# Patient Record
Sex: Male | Born: 1965 | Race: White | Hispanic: No | Marital: Married | Smoking: Never smoker
Health system: Southern US, Community
[De-identification: ages and names within clinical notes are randomized; demographics above are authoritative.]

## PROBLEM LIST (undated history)

## (undated) DIAGNOSIS — K219 Gastro-esophageal reflux disease without esophagitis: Secondary | ICD-10-CM

## (undated) DIAGNOSIS — Z87442 Personal history of urinary calculi: Secondary | ICD-10-CM

## (undated) DIAGNOSIS — H9313 Tinnitus, bilateral: Secondary | ICD-10-CM

## (undated) HISTORY — PX: COLONOSCOPY: SHX174

## (undated) HISTORY — PX: BACK SURGERY: SHX140

---

## 2006-07-01 ENCOUNTER — Ambulatory Visit (HOSPITAL_COMMUNITY): Admission: RE | Admit: 2006-07-01 | Discharge: 2006-07-01 | Payer: Self-pay | Admitting: Family Medicine

## 2011-01-14 ENCOUNTER — Other Ambulatory Visit (HOSPITAL_COMMUNITY): Payer: Self-pay | Admitting: Orthopedic Surgery

## 2011-01-14 ENCOUNTER — Encounter (HOSPITAL_COMMUNITY)
Admission: RE | Admit: 2011-01-14 | Discharge: 2011-01-14 | Disposition: A | Discharge status: Home / Self Care | Payer: BC Managed Care – PPO | Source: Ambulatory Visit | Attending: Orthopedic Surgery | Admitting: Orthopedic Surgery

## 2011-01-14 DIAGNOSIS — M48061 Spinal stenosis, lumbar region without neurogenic claudication: Secondary | ICD-10-CM

## 2011-01-14 LAB — CBC
Hemoglobin: 15 g/dL (ref 13.0–17.0)
Platelets: 250 10*3/uL (ref 150–400)
RDW: 11.8 % (ref 11.5–15.5)
WBC: 6.5 10*3/uL (ref 4.0–10.5)

## 2011-01-14 LAB — SURGICAL PCR SCREEN: MRSA, PCR: NEGATIVE

## 2011-01-24 ENCOUNTER — Ambulatory Visit (HOSPITAL_COMMUNITY)
Admission: RE | Admit: 2011-01-24 | Discharge: 2011-01-25 | DRG: 758 | Disposition: A | Discharge status: Home / Self Care | Payer: BC Managed Care – PPO | Source: Ambulatory Visit | Attending: Orthopedic Surgery | Admitting: Orthopedic Surgery

## 2011-01-24 ENCOUNTER — Other Ambulatory Visit: Payer: Self-pay | Admitting: Orthopedic Surgery

## 2011-01-24 ENCOUNTER — Ambulatory Visit (HOSPITAL_COMMUNITY): Payer: BC Managed Care – PPO

## 2011-01-24 DIAGNOSIS — M48061 Spinal stenosis, lumbar region without neurogenic claudication: Secondary | ICD-10-CM | POA: Insufficient documentation

## 2011-01-24 DIAGNOSIS — Z01810 Encounter for preprocedural cardiovascular examination: Secondary | ICD-10-CM | POA: Insufficient documentation

## 2011-01-24 DIAGNOSIS — Z01812 Encounter for preprocedural laboratory examination: Secondary | ICD-10-CM | POA: Insufficient documentation

## 2011-01-24 DIAGNOSIS — M5126 Other intervertebral disc displacement, lumbar region: Secondary | ICD-10-CM | POA: Insufficient documentation

## 2011-01-25 NOTE — Op Note (Signed)
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  OPERATIVE FINDINGS:  Significantly large L4-5 disk herniation extending from the L4-5 disk into the posterior lateral gutter causing marked central and lateral recess stenosis, causing significant L4 nerve compression and L5 traversing nerve root compression.  HISTORY:  This is a very pleasant 45 year old gentleman who has been having severe back, buttock, and left leg pain for some time now.  After failing conservative treatment consisting of physiotherapy, analgesics,injection therapy, activity modification, he continued to deteriorate and elected to proceed with surgery.  All appropriate risks, benefits, and alternatives to surgery were discussed.  Consent was obtained.  OPERATIVE NOTE:  The patient was brought into the operating room, placed supine on the operating table.  After successful induction of general anesthesia, endotracheal intubation, TED, SCDs, and a Foley were inserted.  He was turned prone onto the [place] frame.  All bony prominences were well-padded.  The back was prepped and draped in standard fashion.  Appropriate time-out was done to confirm patient procedure and all other pertinent important data.  Once this was done, 2 needles were placed into the back.  X-rays were taken for localization of the incision.  The skin was infiltrated with 0.25% epinephrine and a midline incision was made starting from the superior aspect of L4 and proceeding to the superior aspect of S1.  Sharp dissection was  carried out down to the deep fascia.  Deep fascia was sharply incised and I used a Cobb elevator to dissect paraspinal muscles to expose the L4 and L5 lamina and a portion of that of S1.  Once I had bilateral exposure, I placed a Penfield 4 underneath the L5 lamina and took a second x-ray and confirmed I was at the appropriate level.  At this point using a double- action Leksell rongeur, I removed the spinous process of L5 in its entirety and 50% of the L4 spinous process.  I then used 3-mm and 2-mm Kerrison to perform a generous L4 bilateral laminotomy and then a complete left-sided laminectomy of L5.  At this point, I then resected the ligamentum flavum again using 2 and 3-mm Kerrison.  At this point I could see a very large extruded fragment of disk that had come up from the L4-5 disk space and extended into the lateral recess and was compressing and exiting up into the axilla of the L4 nerve root.  I was able to use a Penfield 4 to dissect the fragment free and I removed it. Because of its large size that was actually sent to Pathology for further evaluation.  I then traced the disk herniation back to the 4-5 disk space and then removed the remaining fragment of disk material.  I then used a micropituitary  rongeur and went into the L4-5 disk space to remove any other loose fragments of disk.  There was still significant foraminal compromise causing left L4 nerve root compression and so I used a 2-mm Kerrison rongeur to perform a generous foraminotomy.  I was able to pass my [facility] out the neural foramen and it was no longer under undue tension.  I then went to the contralateral side and completed the L4-5 central decompression and into the lateral gutter.  I then swept the L5 traversing nerve root medially, identified the L4-5 disk space and checked for any central fragments that were coming to the right side.  At this point, I irrigated copiously with normal saline, used  bipolar electrocautery to coagulate the very large epidural veins and then checked once more with my Public house manager.  I palpated superiorly to check that my central decompression and my bilateral decompression at L4-5 was complete.  The lateral recesses were adequately decompressed.  I could visualize the exiting nerve root and was free of any tension.  I then again examined the disk space.  There was no further free fragments of disk.  I could sweep my Woodson elevator circumferentially from lateral to medial.  With the central decompression complete, I then went down the left-sided L5-S1 and again there was no significant tension.  At this point, I irrigated copiously with normal saline, placed the deep drain and then thrombin-soaked Gelfoam patty over the exposed thecal sac.  I then closed in a layered fashion with interrupted #1 Vicryl suture, 2-0 Vicryl suture, and 3-0 Monocryl.  Steri-Strips and dry dressing were applied.  The patient was extubated, transferred to PACU without incident.  At the end of the case, all needle and sponge counts were correct.     Alvy Beal, MD     DDB/MEDQ  D:  01/24/2011  T:  01/24/2011  Job:  [id number]  Electronically Signed by Venita Lick MD on 01/25/2011 03:50:58 PM

## 2012-04-30 IMAGING — CR DG LUMBAR SPINE 2-3V
1 series · 1 of 1 positions shown · non-contrast
Comparison: Preoperative lumbar spine series 01/14/2011.

CLINICAL DATA: Intraoperative localization for lumbar surgery.

LUMBAR SPINE - 2-3 VIEW

[view not recorded]
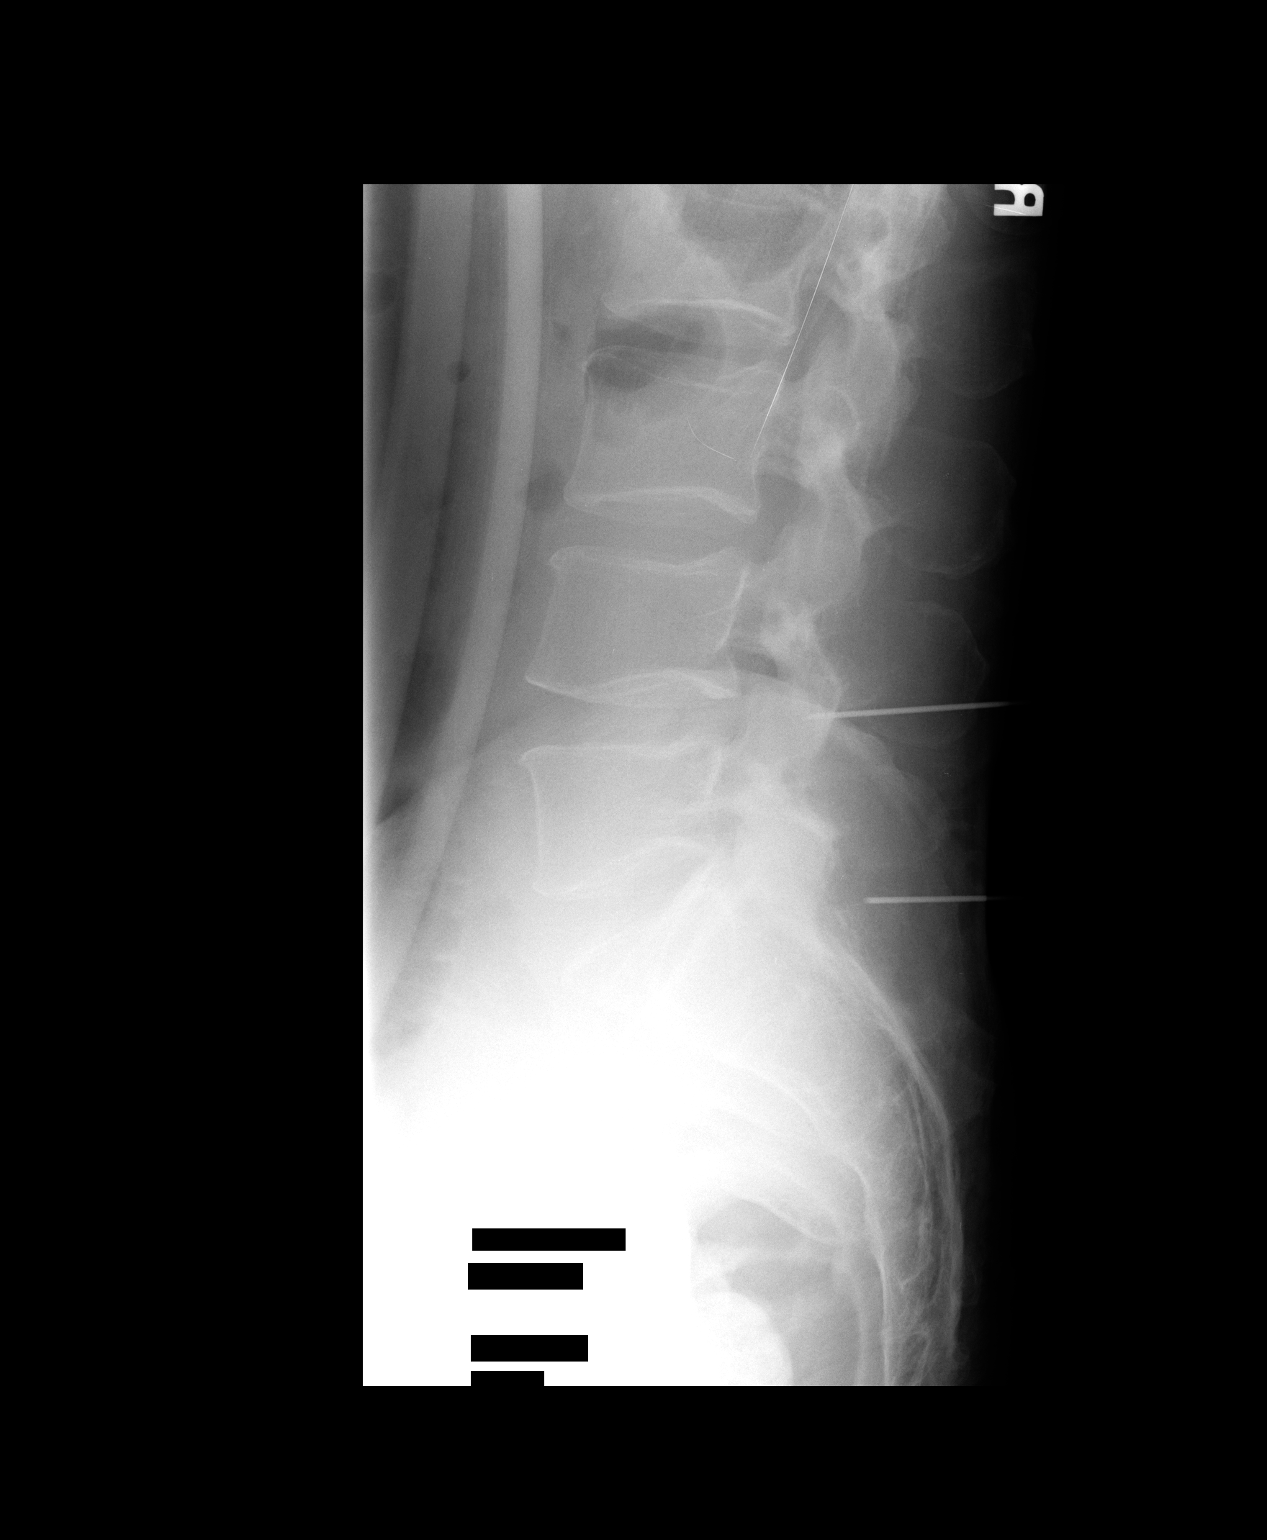

[1 of 1 positions shown; findings below may reference images not displayed]

FINDINGS: There are spinal needles projecting at the L4-5 and L5-S1
disc space levels.
IMPRESSION: L4-5 and L5-S1 marked intraoperatively.

## 2013-05-04 ENCOUNTER — Other Ambulatory Visit: Payer: Self-pay | Admitting: Family Medicine

## 2013-05-04 DIAGNOSIS — R109 Unspecified abdominal pain: Secondary | ICD-10-CM

## 2013-05-05 ENCOUNTER — Other Ambulatory Visit: Payer: BC Managed Care – PPO

## 2013-05-17 ENCOUNTER — Ambulatory Visit
Admission: RE | Admit: 2013-05-17 | Discharge: 2013-05-17 | Disposition: A | Discharge status: Home / Self Care | Payer: BC Managed Care – PPO | Source: Ambulatory Visit | Attending: Family Medicine | Admitting: Family Medicine

## 2013-05-17 DIAGNOSIS — R109 Unspecified abdominal pain: Secondary | ICD-10-CM

## 2015-03-27 ENCOUNTER — Other Ambulatory Visit: Payer: Self-pay | Admitting: Family Medicine

## 2015-03-27 DIAGNOSIS — M79606 Pain in leg, unspecified: Secondary | ICD-10-CM

## 2015-03-30 ENCOUNTER — Other Ambulatory Visit: Payer: BC Managed Care – PPO

## 2015-04-03 ENCOUNTER — Other Ambulatory Visit: Payer: Self-pay | Admitting: *Deleted

## 2015-04-03 DIAGNOSIS — R200 Anesthesia of skin: Secondary | ICD-10-CM

## 2015-04-06 ENCOUNTER — Ambulatory Visit
Admission: RE | Admit: 2015-04-06 | Discharge: 2015-04-06 | Disposition: A | Discharge status: Home / Self Care | Payer: BC Managed Care – PPO | Source: Ambulatory Visit | Attending: Family Medicine | Admitting: Family Medicine

## 2015-04-06 DIAGNOSIS — M79606 Pain in leg, unspecified: Secondary | ICD-10-CM

## 2015-04-11 ENCOUNTER — Ambulatory Visit (INDEPENDENT_AMBULATORY_CARE_PROVIDER_SITE_OTHER): Payer: BC Managed Care – PPO | Admitting: Neurology

## 2015-04-11 DIAGNOSIS — R200 Anesthesia of skin: Secondary | ICD-10-CM | POA: Diagnosis not present

## 2015-04-11 NOTE — Procedures (Signed)
[facility]  [address], [address]  [place], [place] [address] Tel: [phone] Fax:  [phone] Test Date:  04/11/2015  Patient: Dakota CarnesDavid Caya DOB: 10/04/1965 Physician: Nita Sickleonika Ameira Alessandrini  Sex: Male Height: 6\' 2"  Ref Phys: Johny BlamerWilliam Harris, M.D.  ID#: [id number] Temp: 33.1C Technician: Judie PetitM. Dean   Patient Complaints: This is a 49 year old gentleman referred for evaluation of bilateral feet paresthesias.  NCV & EMG Findings: Extensive electrodiagnostic testing of the right lower extremity and additional studies of the left shows: 1. Bilateral sural and superficial peroneal sensory responses are within normal limits. 2. Bilateral peroneal and tibial motor responses are within normal limits. 3. Bilateral tibial H reflex studies are prolonged, and most likely due to patient's height. 4. There is no evidence of active or chronic motor axon loss changes affecting any of the tested muscles. Motor unit configuration and recruitment pattern is within normal limits.  Impression: This is a normal study.  In particular, there is no evidence of a large fiber generalized sensorimotor polyneuropathy or lumbosacral radiculopathy. However, a small fiber neuropathy cannot be excluded by this study.   _____________________________ Nita Sickleonika Catarina Huntley, D.O.    Nerve Conduction Studies Anti Sensory Summary Table   Stim Site NR Peak (ms) Norm Peak (ms) P-T Amp (V) Norm P-T Amp  Left Sup Peroneal Anti Sensory (Ant Lat Mall)  33.1C  12 cm    2.8 <4.6 9.9 >4  Right Sup Peroneal Anti Sensory (Ant Lat Mall)  33.1C  12 cm    3.1 <4.6 7.6 >4  Left Sural Anti Sensory (Lat Mall)  33.1C  Calf    4.0 <4.6 5.8 >4  Right Sural Anti Sensory (Lat Mall)  33.1C  Calf    2.9 <4.6 4.1 >4   Motor Summary Table   Stim Site NR Onset (ms) Norm Onset (ms) O-P Amp (mV) Norm O-P Amp Site1 Site2 Delta-0 (ms) Dist (cm) Vel (m/s) Norm Vel (m/s)  Left Peroneal Motor (Ext Dig Brev)  33.1C  Ankle    4.1 <6.0 3.5  >2.5 B Fib Ankle 8.4 38.0 45 >40  B Fib    12.5  3.2  Poplt B Fib 2.3 10.0 43 >40  Poplt    14.8  3.1         Right Peroneal Motor (Ext Dig Brev)  33.1C  Ankle    4.4 <6.0 3.3 >2.5 B Fib Ankle 7.6 38.0 50 >40  B Fib    12.0  3.4  Poplt B Fib 2.0 10.0 50 >40  Poplt    14.0  3.4         Left Tibial Motor (Abd Hall Brev)  33.1C  Ankle    3.5 <6.0 13.5 >4 Knee Ankle 10.1 42.0 42 >40  Knee    13.6  9.4         Right Tibial Motor (Abd Hall Brev)  33.1C  Ankle    5.1 <6.0 12.5 >4 Knee Ankle 10.1 45.0 45 >40  Knee    15.2  10.1          H Reflex Studies   NR H-Lat (ms) Lat Norm (ms) L-R H-Lat (ms)  Left Tibial (Gastroc)  33.1C     36.60 <35 1.63  Right Tibial (Gastroc)  33.1C     38.23 <35 1.63   EMG   Side Muscle Ins Act Fibs Psw Fasc Number Recrt Dur Dur. Amp Amp. Poly Poly. Comment  Right AntTibialis Nml Nml Nml Nml Nml Nml Nml Nml Nml  Nml Nml Nml N/A  Right Gastroc Nml Nml Nml Nml Nml Nml Nml Nml Nml Nml Nml Nml N/A  Right Flex Dig Long Nml Nml Nml Nml Nml Nml Nml Nml Nml Nml Nml Nml N/A  Right RectFemoris Nml Nml Nml Nml Nml Nml Nml Nml Nml Nml Nml Nml N/A  Right BicepsFemS Nml Nml Nml Nml Nml Nml Nml Nml Nml Nml Nml Nml N/A  Right GluteusMed Nml Nml Nml Nml Nml Nml Nml Nml Nml Nml Nml Nml N/A      Waveforms:

## 2016-09-05 ENCOUNTER — Ambulatory Visit: Payer: Self-pay | Admitting: Physician Assistant

## 2016-09-13 ENCOUNTER — Other Ambulatory Visit: Payer: Self-pay | Admitting: Orthopedic Surgery

## 2016-09-23 ENCOUNTER — Encounter (HOSPITAL_COMMUNITY)
Admission: RE | Admit: 2016-09-23 | Discharge: 2016-09-23 | Disposition: A | Discharge status: Home / Self Care | Payer: BC Managed Care – PPO | Source: Ambulatory Visit | Attending: Orthopedic Surgery | Admitting: Orthopedic Surgery

## 2016-09-23 ENCOUNTER — Encounter (HOSPITAL_COMMUNITY): Payer: Self-pay

## 2016-09-23 HISTORY — DX: Gastro-esophageal reflux disease without esophagitis: K21.9

## 2016-09-23 HISTORY — DX: Personal history of urinary calculi: Z87.442

## 2016-09-23 HISTORY — DX: Tinnitus, bilateral: H93.13

## 2016-09-23 LAB — BASIC METABOLIC PANEL
ANION GAP: 6 (ref 5–15)
BUN: 14 mg/dL (ref 6–20)
CHLORIDE: 105 mmol/L (ref 101–111)
CO2: 27 mmol/L (ref 22–32)
Calcium: 9.2 mg/dL (ref 8.9–10.3)
Creatinine, Ser: 0.94 mg/dL (ref 0.61–1.24)
GFR calc Af Amer: >60 mL/min (ref >60)
GFR calc non Af Amer: >60 mL/min (ref >60)
GLUCOSE: 104 mg/dL — AB (ref 65–99)
POTASSIUM: 4 mmol/L (ref 3.5–5.1)
Sodium: 138 mmol/L (ref 135–145)

## 2016-09-23 LAB — CBC
HEMATOCRIT: 43.9 % (ref 39.0–52.0)
HEMOGLOBIN: 15.4 g/dL (ref 13.0–17.0)
MCH: 35.4 pg — AB (ref 26.0–34.0)
MCHC: 35.1 g/dL (ref 30.0–36.0)
MCV: 100.9 fL — AB (ref 78.0–100.0)
PLATELETS: 192 10*3/uL (ref 150–400)
RBC: 4.35 MIL/uL (ref 4.22–5.81)
RDW: 11.9 % (ref 11.5–15.5)
WBC: 4.1 10*3/uL (ref 4.0–10.5)

## 2016-09-23 LAB — SURGICAL PCR SCREEN
MRSA, PCR: NEGATIVE
STAPHYLOCOCCUS AUREUS: NEGATIVE

## 2016-09-23 NOTE — Pre-Procedure Instructions (Signed)
    SAVAS ELVIN  09/23/2016      [id number] [place], [place] - [address] [place] [address] Phone: [phone] Fax: [phone]    Your procedure is scheduled on Thursday, May 3rd   Report to [facility] Admitting at 5:30 AM             (posted surgery time 7:30 am - 12:14 pm) .  Call this number if you have problems the morning of surgery:  [phone], for other questions [phone] Mon-Fri 8-4:30 pm   Remember:  Do not eat food or drink liquids after midnight Wednesay.   Take these medicines the morning of surgery with A SIP OF WATER : Gabapentin              4-5 days prior to surgery, STOP taking any Vitamins, Herbal Supplements, Anti-inflammatories   Do not wear jewelry - no rings or watches.  Do not wear lotions, colognes or deoderant.             Men may shave face and neck.   Do not bring valuables to the hospital.  [facility] is not responsible for any belongings or valuables.  Contacts, dentures or bridgework may not be worn into surgery.  Leave your suitcase in the car.  After surgery it may be brought to your room.  For patients admitted to the hospital, discharge time will be determined by your treatment team.   Please read over the following fact sheets that you were given. Pain Booklet, MRSA Information and Surgical Site Infection Prevention

## 2016-09-23 NOTE — Progress Notes (Signed)
PCP is Dr. Dossie Arbour  LOV 08/2016 Denies any cardiac issues, no tests, hasn't seen a cardio.

## 2016-09-26 ENCOUNTER — Inpatient Hospital Stay (HOSPITAL_COMMUNITY)
Admission: RE | Admit: 2016-09-26 | Discharge: 2016-09-27 | DRG: 473 | Disposition: A | Discharge status: Home / Self Care | Payer: BC Managed Care – PPO | Source: Ambulatory Visit | Attending: Orthopedic Surgery | Admitting: Orthopedic Surgery

## 2016-09-26 ENCOUNTER — Inpatient Hospital Stay (HOSPITAL_COMMUNITY): Payer: BC Managed Care – PPO | Admitting: Anesthesiology

## 2016-09-26 ENCOUNTER — Encounter (HOSPITAL_COMMUNITY)
Admission: RE | Disposition: A | Discharge status: Home / Self Care | Payer: Self-pay | Source: Ambulatory Visit | Attending: Orthopedic Surgery

## 2016-09-26 ENCOUNTER — Inpatient Hospital Stay (HOSPITAL_COMMUNITY): Payer: BC Managed Care – PPO

## 2016-09-26 ENCOUNTER — Encounter (HOSPITAL_COMMUNITY): Payer: Self-pay | Admitting: *Deleted

## 2016-09-26 DIAGNOSIS — M47812 Spondylosis without myelopathy or radiculopathy, cervical region: Principal | ICD-10-CM | POA: Diagnosis present

## 2016-09-26 DIAGNOSIS — G959 Disease of spinal cord, unspecified: Secondary | ICD-10-CM | POA: Diagnosis present

## 2016-09-26 DIAGNOSIS — Z981 Arthrodesis status: Secondary | ICD-10-CM

## 2016-09-26 DIAGNOSIS — Z419 Encounter for procedure for purposes other than remedying health state, unspecified: Secondary | ICD-10-CM

## 2016-09-26 HISTORY — PX: ANTERIOR CERVICAL DECOMP/DISCECTOMY FUSION: SHX1161

## 2016-09-26 SURGERY — ANTERIOR CERVICAL DECOMPRESSION/DISCECTOMY FUSION 3 LEVELS
Anesthesia: General | Site: Spine Cervical

## 2016-09-26 MED ORDER — PROPOFOL 500 MG/50ML IV EMUL
INTRAVENOUS | Status: DC | PRN
  Administered 2016-09-26: 75 ug/kg/min via INTRAVENOUS

## 2016-09-26 MED ORDER — ONDANSETRON HCL 4 MG/2ML IJ SOLN
INTRAMUSCULAR | Status: AC
  Filled 2016-09-26: qty 2

## 2016-09-26 MED ORDER — MIDAZOLAM HCL 5 MG/5ML IJ SOLN
INTRAMUSCULAR | Status: DC | PRN
  Administered 2016-09-26 (×2): 1 mg via INTRAVENOUS
  Administered 2016-09-26: 2 mg via INTRAVENOUS
  Administered 2016-09-26 (×2): 1 mg via INTRAVENOUS

## 2016-09-26 MED ORDER — SODIUM CHLORIDE 0.9% FLUSH
3.0000 mL | Freq: Two times a day (BID) | INTRAVENOUS | Status: DC
  Administered 2016-09-26: 3 mL via INTRAVENOUS

## 2016-09-26 MED ORDER — DEXAMETHASONE SODIUM PHOSPHATE 10 MG/ML IJ SOLN
INTRAMUSCULAR | Status: DC | PRN
  Administered 2016-09-26: 10 mg via INTRAVENOUS

## 2016-09-26 MED ORDER — METHOCARBAMOL 500 MG PO TABS
500.0000 mg | ORAL_TABLET | Freq: Four times a day (QID) | ORAL | Status: DC | PRN
  Administered 2016-09-26 – 2016-09-27 (×3): 500 mg via ORAL
  Filled 2016-09-26 (×2): qty 1

## 2016-09-26 MED ORDER — LACTATED RINGERS IV SOLN: INTRAVENOUS | Status: DC

## 2016-09-26 MED ORDER — HYDROMORPHONE HCL 1 MG/ML IJ SOLN
INTRAMUSCULAR | Status: AC
  Filled 2016-09-26: qty 1

## 2016-09-26 MED ORDER — DEXAMETHASONE SODIUM PHOSPHATE 4 MG/ML IJ SOLN: 4.0000 mg | Freq: Four times a day (QID) | INTRAMUSCULAR | Status: AC

## 2016-09-26 MED ORDER — LACTATED RINGERS IV SOLN
INTRAVENOUS | Status: DC | PRN
  Administered 2016-09-26: 08:00:00 via INTRAVENOUS

## 2016-09-26 MED ORDER — SODIUM CHLORIDE 0.9 % IV SOLN
INTRAVENOUS | Status: DC | PRN
  Administered 2016-09-26: 30 ug/min via INTRAVENOUS

## 2016-09-26 MED ORDER — ACETAMINOPHEN 650 MG RE SUPP: 650.0000 mg | RECTAL | Status: DC | PRN

## 2016-09-26 MED ORDER — MIDAZOLAM HCL 2 MG/2ML IJ SOLN
INTRAMUSCULAR | Status: AC
  Filled 2016-09-26: qty 2

## 2016-09-26 MED ORDER — ONDANSETRON HCL 4 MG/2ML IJ SOLN: 4.0000 mg | Freq: Once | INTRAMUSCULAR | Status: DC | PRN

## 2016-09-26 MED ORDER — LACTATED RINGERS IV SOLN
INTRAVENOUS | Status: DC | PRN
  Administered 2016-09-26 (×2): via INTRAVENOUS

## 2016-09-26 MED ORDER — PROPOFOL 10 MG/ML IV BOLUS
INTRAVENOUS | Status: AC
  Filled 2016-09-26: qty 20

## 2016-09-26 MED ORDER — ACETAMINOPHEN 325 MG PO TABS: 650.0000 mg | ORAL_TABLET | ORAL | Status: DC | PRN

## 2016-09-26 MED ORDER — OXYCODONE HCL 5 MG PO TABS
ORAL_TABLET | ORAL | Status: AC
  Filled 2016-09-26: qty 2

## 2016-09-26 MED ORDER — DEXTROSE 5 % IV SOLN
500.0000 mg | Freq: Four times a day (QID) | INTRAVENOUS | Status: DC | PRN
  Filled 2016-09-26: qty 5

## 2016-09-26 MED ORDER — MIDAZOLAM HCL 2 MG/2ML IJ SOLN
INTRAMUSCULAR | Status: AC
Start: 2016-09-26 — End: 2016-09-26
  Filled 2016-09-26: qty 2

## 2016-09-26 MED ORDER — OXYCODONE HCL 5 MG/5ML PO SOLN: 5.0000 mg | Freq: Once | ORAL | Status: DC | PRN

## 2016-09-26 MED ORDER — SODIUM CHLORIDE 0.9% FLUSH: 3.0000 mL | INTRAVENOUS | Status: DC | PRN

## 2016-09-26 MED ORDER — HEMOSTATIC AGENTS (NO CHARGE) OPTIME
TOPICAL | Status: DC | PRN
  Administered 2016-09-26: 1 via TOPICAL

## 2016-09-26 MED ORDER — PHENOL 1.4 % MT LIQD: 1.0000 | OROMUCOSAL | Status: DC | PRN

## 2016-09-26 MED ORDER — PROPOFOL 10 MG/ML IV BOLUS
INTRAVENOUS | Status: DC | PRN
  Administered 2016-09-26: 200 mg via INTRAVENOUS
  Administered 2016-09-26: 50 mg via INTRAVENOUS

## 2016-09-26 MED ORDER — DEXAMETHASONE SODIUM PHOSPHATE 10 MG/ML IJ SOLN
INTRAMUSCULAR | Status: AC
  Filled 2016-09-26: qty 1

## 2016-09-26 MED ORDER — METHOCARBAMOL 500 MG PO TABS
ORAL_TABLET | ORAL | Status: AC
  Filled 2016-09-26: qty 1

## 2016-09-26 MED ORDER — ONDANSETRON HCL 4 MG/2ML IJ SOLN: 4.0000 mg | Freq: Four times a day (QID) | INTRAMUSCULAR | Status: DC | PRN

## 2016-09-26 MED ORDER — SODIUM CHLORIDE 0.9 % IV SOLN
0.0500 ug/kg/min | INTRAVENOUS | Status: AC
  Administered 2016-09-26: .2 ug/kg/min via INTRAVENOUS
  Filled 2016-09-26: qty 5000

## 2016-09-26 MED ORDER — GABAPENTIN 400 MG PO CAPS
800.0000 mg | ORAL_CAPSULE | Freq: Two times a day (BID) | ORAL | Status: DC
  Administered 2016-09-26 – 2016-09-27 (×2): 800 mg via ORAL
  Filled 2016-09-26 (×2): qty 2

## 2016-09-26 MED ORDER — ACETAMINOPHEN 10 MG/ML IV SOLN
INTRAVENOUS | Status: DC | PRN
  Administered 2016-09-26: 1000 mg via INTRAVENOUS

## 2016-09-26 MED ORDER — HYDROMORPHONE HCL 1 MG/ML IJ SOLN
INTRAMUSCULAR | Status: AC
  Administered 2016-09-26: 0.5 mg via INTRAVENOUS
  Filled 2016-09-26: qty 1

## 2016-09-26 MED ORDER — CEFAZOLIN SODIUM-DEXTROSE 2-4 GM/100ML-% IV SOLN
2.0000 g | INTRAVENOUS | Status: DC
  Filled 2016-09-26: qty 100

## 2016-09-26 MED ORDER — PROPOFOL 1000 MG/100ML IV EMUL
INTRAVENOUS | Status: AC
  Filled 2016-09-26: qty 300

## 2016-09-26 MED ORDER — MENTHOL 3 MG MT LOZG: 1.0000 | LOZENGE | OROMUCOSAL | Status: DC | PRN

## 2016-09-26 MED ORDER — 0.9 % SODIUM CHLORIDE (POUR BTL) OPTIME
TOPICAL | Status: DC | PRN
Start: 2016-09-26 — End: 2016-09-26
  Administered 2016-09-26: 1000 mL

## 2016-09-26 MED ORDER — THROMBIN 20000 UNITS EX SOLR
CUTANEOUS | Status: AC
  Filled 2016-09-26: qty 20000

## 2016-09-26 MED ORDER — LIDOCAINE HCL (CARDIAC) 20 MG/ML IV SOLN
INTRAVENOUS | Status: DC | PRN
  Administered 2016-09-26: 60 mg via INTRAVENOUS

## 2016-09-26 MED ORDER — BUPIVACAINE HCL (PF) 0.25 % IJ SOLN
INTRAMUSCULAR | Status: DC | PRN
  Administered 2016-09-26: 8 mL

## 2016-09-26 MED ORDER — LIDOCAINE 2% (20 MG/ML) 5 ML SYRINGE
INTRAMUSCULAR | Status: AC
  Filled 2016-09-26: qty 5

## 2016-09-26 MED ORDER — OXYCODONE HCL 5 MG PO TABS
10.0000 mg | ORAL_TABLET | ORAL | Status: DC | PRN
  Administered 2016-09-26 – 2016-09-27 (×5): 10 mg via ORAL
  Filled 2016-09-26 (×4): qty 2

## 2016-09-26 MED ORDER — SUCCINYLCHOLINE 20MG/ML (10ML) SYRINGE FOR MEDFUSION PUMP - OPTIME
INTRAMUSCULAR | Status: DC | PRN
  Administered 2016-09-26: 140 mg via INTRAVENOUS

## 2016-09-26 MED ORDER — FENTANYL CITRATE (PF) 100 MCG/2ML IJ SOLN
INTRAMUSCULAR | Status: DC | PRN
  Administered 2016-09-26: 50 ug via INTRAVENOUS

## 2016-09-26 MED ORDER — ACETAMINOPHEN 10 MG/ML IV SOLN
INTRAVENOUS | Status: AC
  Filled 2016-09-26: qty 100

## 2016-09-26 MED ORDER — PROPOFOL 500 MG/50ML IV EMUL
INTRAVENOUS | Status: AC
  Filled 2016-09-26: qty 100

## 2016-09-26 MED ORDER — THROMBIN 20000 UNITS EX SOLR
CUTANEOUS | Status: DC | PRN
  Administered 2016-09-26: 20 mL via TOPICAL

## 2016-09-26 MED ORDER — POLYETHYLENE GLYCOL 3350 17 G PO PACK: 17.0000 g | PACK | Freq: Every day | ORAL | Status: DC | PRN

## 2016-09-26 MED ORDER — DEXAMETHASONE 4 MG PO TABS
4.0000 mg | ORAL_TABLET | Freq: Four times a day (QID) | ORAL | Status: AC
  Administered 2016-09-26 – 2016-09-27 (×3): 4 mg via ORAL
  Filled 2016-09-26 (×3): qty 1

## 2016-09-26 MED ORDER — SUCCINYLCHOLINE CHLORIDE 200 MG/10ML IV SOSY
PREFILLED_SYRINGE | INTRAVENOUS | Status: AC
  Filled 2016-09-26: qty 10

## 2016-09-26 MED ORDER — OXYCODONE HCL 5 MG PO TABS: 5.0000 mg | ORAL_TABLET | Freq: Once | ORAL | Status: DC | PRN

## 2016-09-26 MED ORDER — FENTANYL CITRATE (PF) 100 MCG/2ML IJ SOLN
INTRAMUSCULAR | Status: AC
  Filled 2016-09-26: qty 2

## 2016-09-26 MED ORDER — CEFAZOLIN SODIUM-DEXTROSE 2-4 GM/100ML-% IV SOLN
2.0000 g | Freq: Three times a day (TID) | INTRAVENOUS | Status: AC
  Administered 2016-09-26 – 2016-09-27 (×2): 2 g via INTRAVENOUS
  Filled 2016-09-26 (×2): qty 100

## 2016-09-26 MED ORDER — ONDANSETRON HCL 4 MG PO TABS
4.0000 mg | ORAL_TABLET | Freq: Four times a day (QID) | ORAL | Status: DC | PRN
Start: 2016-09-26 — End: 2016-09-27

## 2016-09-26 MED ORDER — PHENYLEPHRINE 40 MCG/ML (10ML) SYRINGE FOR IV PUSH (FOR BLOOD PRESSURE SUPPORT)
PREFILLED_SYRINGE | INTRAVENOUS | Status: AC
  Filled 2016-09-26: qty 10

## 2016-09-26 MED ORDER — ONDANSETRON HCL 4 MG/2ML IJ SOLN
INTRAMUSCULAR | Status: DC | PRN
  Administered 2016-09-26: 4 mg via INTRAVENOUS

## 2016-09-26 MED ORDER — HYDROMORPHONE HCL 1 MG/ML IJ SOLN
0.2500 mg | INTRAMUSCULAR | Status: DC | PRN
  Administered 2016-09-26 (×3): 0.5 mg via INTRAVENOUS

## 2016-09-26 MED ORDER — BUPIVACAINE HCL (PF) 0.25 % IJ SOLN
INTRAMUSCULAR | Status: AC
  Filled 2016-09-26: qty 30

## 2016-09-26 MED ORDER — CEFAZOLIN SODIUM-DEXTROSE 2-4 GM/100ML-% IV SOLN
2.0000 g | INTRAVENOUS | Status: AC
  Administered 2016-09-26 (×2): 2 g via INTRAVENOUS
  Filled 2016-09-26: qty 100

## 2016-09-26 SURGICAL SUPPLY — 73 items
BLADE CLIPPER SURG (BLADE) ×3 IMPLANT
BONE VIVIGEN FORMABLE 1.3CC (Bone Implant) ×9 IMPLANT
BUR EGG ELITE 4.0 (BURR) IMPLANT
BUR EGG ELITE 4.0MM (BURR)
BUR MATCHSTICK NEURO 3.0 LAGG (BURR) ×3 IMPLANT
CAGE CERV NANOLOCK LG 7 6D (Spacer) ×6 IMPLANT
CAGE CERV NANOLOCK LG 7MM 6D (Spacer) ×3 IMPLANT
CANISTER SUCT 3000ML PPV (MISCELLANEOUS) ×3 IMPLANT
CLOSURE STERI-STRIP 1/2X4 (GAUZE/BANDAGES/DRESSINGS) ×1
CLSR STERI-STRIP ANTIMIC 1/2X4 (GAUZE/BANDAGES/DRESSINGS) ×2 IMPLANT
CORDS BIPOLAR (ELECTRODE) ×3 IMPLANT
COVER SURGICAL LIGHT HANDLE (MISCELLANEOUS) ×6 IMPLANT
CRADLE DONUT ADULT HEAD (MISCELLANEOUS) ×3 IMPLANT
DRAIN TLS ROUND 10FR (DRAIN) ×3 IMPLANT
DRAPE C-ARM 42X72 X-RAY (DRAPES) ×3 IMPLANT
DRAPE POUCH INSTRU U-SHP 10X18 (DRAPES) ×3 IMPLANT
DRAPE SURG 17X23 STRL (DRAPES) ×3 IMPLANT
DRAPE U-SHAPE 47X51 STRL (DRAPES) ×3 IMPLANT
DRILL BIT EAGLE PLUS 14MM (BIT) ×3 IMPLANT
DRSG MEPILEX BORDER 4X8 (GAUZE/BANDAGES/DRESSINGS) ×3 IMPLANT
DURAPREP 6ML APPLICATOR 50/CS (WOUND CARE) ×3 IMPLANT
ELECT COATED BLADE 2.86 ST (ELECTRODE) ×3 IMPLANT
ELECT PENCIL ROCKER SW 15FT (MISCELLANEOUS) ×3 IMPLANT
ELECT REM PT RETURN 9FT ADLT (ELECTROSURGICAL) ×3
ELECTRODE REM PT RTRN 9FT ADLT (ELECTROSURGICAL) ×1 IMPLANT
FEE INTRAOP MONITOR IMPULS NCS (MISCELLANEOUS) ×1 IMPLANT
GLOVE BIO SURGEON STRL SZ 6.5 (GLOVE) ×2 IMPLANT
GLOVE BIO SURGEONS STRL SZ 6.5 (GLOVE) ×1
GLOVE BIOGEL PI IND STRL 6.5 (GLOVE) ×1 IMPLANT
GLOVE BIOGEL PI IND STRL 8.5 (GLOVE) ×1 IMPLANT
GLOVE BIOGEL PI INDICATOR 6.5 (GLOVE) ×2
GLOVE BIOGEL PI INDICATOR 8.5 (GLOVE) ×2
GLOVE SS BIOGEL STRL SZ 8.5 (GLOVE) ×1 IMPLANT
GLOVE SUPERSENSE BIOGEL SZ 8.5 (GLOVE) ×2
GOWN STRL REUS W/ TWL XL LVL3 (GOWN DISPOSABLE) ×2 IMPLANT
GOWN STRL REUS W/TWL 2XL LVL3 (GOWN DISPOSABLE) ×6 IMPLANT
GOWN STRL REUS W/TWL XL LVL3 (GOWN DISPOSABLE) ×4
INTRAOP MONITOR FEE IMPULS NCS (MISCELLANEOUS) ×1
INTRAOP MONITOR FEE IMPULSE (MISCELLANEOUS) ×2
KIT BASIN OR (CUSTOM PROCEDURE TRAY) ×3 IMPLANT
KIT ROOM TURNOVER OR (KITS) ×3 IMPLANT
NEEDLE SPNL 18GX3.5 QUINCKE PK (NEEDLE) ×3 IMPLANT
NS IRRIG 1000ML POUR BTL (IV SOLUTION) ×12 IMPLANT
PACK ORTHO CERVICAL (CUSTOM PROCEDURE TRAY) ×3 IMPLANT
PACK UNIVERSAL I (CUSTOM PROCEDURE TRAY) ×3 IMPLANT
PAD ARMBOARD 7.5X6 YLW CONV (MISCELLANEOUS) ×9 IMPLANT
PATTIES SURGICAL .25X.25 (GAUZE/BANDAGES/DRESSINGS) ×3 IMPLANT
PATTIES SURGICAL .5 X.5 (GAUZE/BANDAGES/DRESSINGS) IMPLANT
PIN DISTRACTION 14 (PIN) ×9 IMPLANT
PIN TEMP SKYLINE THREADED (PIN) ×3 IMPLANT
PLATE 3LVL 57M SWIFT PLUS CERV (Plate) ×3 IMPLANT
RESTRAINT LIMB HOLDER UNIV (RESTRAINTS) ×3 IMPLANT
SCREW SD-VA 14M SWIFT PLUS (Screw) ×24 IMPLANT
SPONGE INTESTINAL PEANUT (DISPOSABLE) ×6 IMPLANT
SPONGE LAP 4X18 X RAY DECT (DISPOSABLE) ×6 IMPLANT
SPONGE SURGIFOAM ABS GEL 100 (HEMOSTASIS) ×3 IMPLANT
SURGIFLO W/THROMBIN 8M KIT (HEMOSTASIS) ×3 IMPLANT
SUT BONE WAX W31G (SUTURE) ×3 IMPLANT
SUT ETHILON 3 0 PS 1 (SUTURE) ×3 IMPLANT
SUT MON AB 3-0 SH 27 (SUTURE) ×2
SUT MON AB 3-0 SH27 (SUTURE) ×1 IMPLANT
SUT SILK 2 0 (SUTURE) ×2
SUT SILK 2-0 18XBRD TIE 12 (SUTURE) ×1 IMPLANT
SUT VIC AB 2-0 CT1 18 (SUTURE) ×3 IMPLANT
SWIFT CLIP ×6 IMPLANT
SYR BULB IRRIGATION 50ML (SYRINGE) ×3 IMPLANT
SYR CONTROL 10ML LL (SYRINGE) ×9 IMPLANT
TAPE CLOTH 4X10 WHT NS (GAUZE/BANDAGES/DRESSINGS) ×3 IMPLANT
TAPE UMBILICAL COTTON 1/8X30 (MISCELLANEOUS) ×6 IMPLANT
TOWEL OR 17X24 6PK STRL BLUE (TOWEL DISPOSABLE) ×3 IMPLANT
TOWEL OR 17X26 10 PK STRL BLUE (TOWEL DISPOSABLE) ×3 IMPLANT
TRAY FOLEY W/METER SILVER 16FR (SET/KITS/TRAYS/PACK) ×3 IMPLANT
WATER STERILE IRR 1000ML POUR (IV SOLUTION) IMPLANT

## 2016-09-26 NOTE — Anesthesia Postprocedure Evaluation (Addendum)
Anesthesia Post Note  Patient: Dakota LenzDavid M Knight  Procedure(s) Performed: Procedure(s) (LRB): Anterior Cervical Discectomy Fusion  Cervical Four-Five, Five-Six, Six-Seven (N/A)  Patient location during evaluation: PACU Anesthesia Type: General Level of consciousness: awake, awake and alert and oriented Pain management: pain level controlled Vital Signs Assessment: post-procedure vital signs reviewed and stable Respiratory status: spontaneous breathing, nonlabored ventilation and respiratory function stable Cardiovascular status: blood pressure returned to baseline Anesthetic complications: no       Last Vitals:  Vitals:   09/26/16 1506 09/26/16 1521  BP: (!) 150/102 (!) 145/96  Pulse: 79 82  Resp: 15 (!) 0  Temp:      Last Pain:  Vitals:   09/26/16 1915  TempSrc:   PainSc: 7                  Kinsie Belford,Nelvin COKER

## 2016-09-26 NOTE — Anesthesia Preprocedure Evaluation (Addendum)
Anesthesia Evaluation  Patient identified by MRN, date of birth, ID band Patient awake    Reviewed: Allergy & Precautions, NPO status , Patient's Chart, lab work & pertinent test results  Airway Mallampati: II  TM Distance: >3 FB Neck ROM: Limited    Dental  (+) Teeth Intact, Dental Advisory Given   Pulmonary    breath sounds clear to auscultation       Cardiovascular  Rhythm:Regular Rate:Normal     Neuro/Psych    GI/Hepatic   Endo/Other    Renal/GU      Musculoskeletal   Abdominal   Peds  Hematology   Anesthesia Other Findings   Reproductive/Obstetrics                            Anesthesia Physical Anesthesia Plan  ASA: III  Anesthesia Plan: General   Post-op Pain Management:    Induction: Intravenous  Airway Management Planned: Oral ETT  Additional Equipment:   Intra-op Plan:   Post-operative Plan: Extubation in OR  Informed Consent: I have reviewed the patients History and Physical, chart, labs and discussed the procedure including the risks, benefits and alternatives for the proposed anesthesia with the patient or authorized representative who has indicated his/her understanding and acceptance.   Dental advisory given  Plan Discussed with: CRNA and Anesthesiologist  Anesthesia Plan Comments:        Anesthesia Quick Evaluation

## 2016-09-26 NOTE — Brief Op Note (Signed)
09/26/2016  2:19 PM  PATIENT:  Dakota Knight  51 y.o. male  PRE-OPERATIVE DIAGNOSIS:  Cervical spondylitic myelopathy  POST-OPERATIVE DIAGNOSIS:  Cervical spondylitic myelopathy  PROCEDURE:  Procedure(s) with comments: Anterior Cervical Discectomy Fusion  Cervical Four-Five, Five-Six, Six-Seven (N/A) - Requests 5 hours  SURGEON:  Surgeon(s) and Role:    * Venita Lickahari Lashane Whelpley, MD - Primary  PHYSICIAN ASSISTANT:   ASSISTANTS: Carmen Mayo   ANESTHESIA:   general  EBL:  Total I/O In: 1000 [I.V.:1000] Out: 1800 [Urine:1600; Blood:200]  BLOOD ADMINISTERED:none  DRAINS: 1 drain anterior neck   LOCAL MEDICATIONS USED:  MARCAINE     SPECIMEN:  No Specimen  DISPOSITION OF SPECIMEN:  N/A  COUNTS:  YES  TOURNIQUET:  * No tourniquets in log *  DICTATION: .Other Dictation: Dictation Number [id number]  PLAN OF CARE: Admit to inpatient   PATIENT DISPOSITION:  PACU - hemodynamically stable.

## 2016-09-26 NOTE — H&P (Signed)
History of Present Illness  The patient is a 51 year old male who presents for a follow-up for H & P. The patient is scheduled for a ACDF C4-7, corpectomy C5 to be performed by Dr. Debria Garretahari D. Shon BatonBrooks, MD at [facility] on 09-26-16 . Please see the hospital record for complete dictated history and physical. Pt reports a hx of good health.  Problem List/Past Medical  Problems Reconciled   Allergies No Known Drug Allergies  Allergies Reconciled   Social History  Tobacco use  Never smoker. Alcohol use  Occasional alcohol use.  Medication History  Neurontin (300MG  Capsule, 1 (one) Oral tid, Taken starting 09/17/2016) Active. Ibuprofen (200MG  Tablet, Oral) Active. Multivitamin Adult (Oral) Active. Medications Reconciled  Past Surgical History  Back Surgery   Vitals  09/23/2016 8:11 AM Weight: 205 lb Height: 74in Body Surface Area: 2.2 m Body Mass Index: 26.32 kg/m  Temp.: 98.30F(Oral)  Pulse: 90 (Regular)  BP: 129/83 (Sitting, Left Arm, Standard)  General General Appearance-Not in acute distress. Orientation-Oriented X3. Build & Nutrition-Well nourished and Well developed.  Integumentary General Characteristics Surgical Scars - no surgical scar evidence of previous cervical surgery. Cervical Spine-Skin examination of the cervical spine is without deformity, skin lesions, lacerations or abrasions.  Chest and Lung Exam Auscultation Breath sounds - Normal and Clear.  Cardiovascular Auscultation Rhythm - Regular rate and rhythm.  Peripheral Vascular Upper Extremity Palpation - Radial pulse - Bilateral - 2+.  Neurologic Sensation Upper Extremity - Left - sensation is diminished in the upper extremity. Right - sensation is intact in the upper extremity. Reflexes Biceps Reflex - Bilateral - 1+. Brachioradialis Reflex - Bilateral - 1+. Triceps Reflex - Bilateral - 1+. Hoffman's Sign - Left - Hoffman's sign  present.  Musculoskeletal Spine/Ribs/Pelvis  Cervical Spine : Inspection and Palpation - Tenderness - right cervical paraspinals tender to palpation and left cervical paraspinals tender to palpation. Strength and Tone: Strength: Strength: Strength - Triceps - Bilateral - 5/5. Right - 5/5. Deltoid - Left - 3/5. Biceps - Left - 4-/5. Right - 5/5. Wrist Extension - Bilateral - 5/5. Hand Grip - Bilateral - 5/5. Heel walk - Bilateral - able to heel walk with moderate difficulty. Toe Walk - Bilateral - able to walk on toes with moderate difficulty. Heel-Toe Walk - Bilateral - able to heel-toe walk with moderate difficulty. ROM - Flexion - Moderately Decreased and painful. Extension - Moderately Decreased and painful. Left Lateral Flexion - Moderately Decreased and painful. Right Lateral Flexion - Moderately Decreased and painful. Left Rotation - Mildly decreased and painful. Right Rotation - Moderately Decreased and painful. Pain - neither flexion or extension is more painful than the other. Cervical Spine - Special Testing - axial compression test negative, cross chest impingement test negative. Non-Anatomic Signs - No non-anatomic signs present. Upper Extremity Range of Motion - No truesholder pain with IR/ER of the shoulders.  On clinical exam, he has a positive Hoffmann sign on the left upper extremity. He has diminished 1+ deep tendon reflexes in the upper and lower extremities. Unequivocal Babinski test. He does have a slight gait disturbance and difficulty maintaining his balance, but it is not constant. He has got 3/5 deltoid strength, 4/5 biceps strength on the left side, 5/5 triceps and grip strength on the left. He has 5/5 strength on the right. He has pain with cervical spine range of motion, positive Lhermitte sign into the left arm. He does not have any significant shoulder or elbow or wrist pain with  joint range of motion. He has very little glenohumeral motion because of the deltoid weakness.  Most of his left shoulder motion is scapulothoracic.  RADIOGRAPHS His MRI from 08/24/2016 shows significant large disc herniation, hard disc osteophytes at C4-5 and C5-6. There is severe canal stenosis with cord signal changes at 4-5 and to a greater degree at 5-6. At 6-7, there is a large osteophyte complex, no cord signal change, but there is severe canal stenosis and severe left and moderate to severe right foraminal stenosis.  Plans Transcription At this point in time, the patient has multilevel cervical spondylitic disease with cord signal changes. My concern is the cord compression. My recommendation at this time is to move forward with a multilevel anterior cervical decompression procedure. This would most likely either be a three-level anterior cervical discectomy and fusion or a C5 corpectomy with an ACDF at C6-7. The decompression would allow me to address the cord compression and stenosis. This would hopefully relieve or prevent worsening of his myelopathy. The natural history of cervical myelopathy is one of progression. In addition, my concern at the 6-7 level is that with such a stiff segment superior towards the central stenosis can intensify and create problems in the future and that is the reason to move forward down to C7. I have explained the risks to the patient and his wife. These include infection, bleeding, nerve damage, death, stroke, paralysis, permanent nerve damage, throat pain, swallowing difficulties, hoarseness in the voice, need for additional surgery, ongoing or worst pain, need for posterior decompression with instrumentation. All of their questions were encouraged and addressed. We will plan on moving forward with surgery in the first part of May.

## 2016-09-26 NOTE — Transfer of Care (Signed)
Immediate Anesthesia Transfer of Care Note  Patient: Dakota LenzDavid M Knight  Procedure(s) Performed: Procedure(s) with comments: Anterior Cervical Discectomy Fusion  Cervical Four-Five, Five-Six, Six-Seven (N/A) - Requests 5 hours  Patient Location: PACU  Anesthesia Type:General  Level of Consciousness: awake, oriented, sedated, drowsy and patient cooperative  Airway & Oxygen Therapy: Patient Spontanous Breathing and Patient connected to face mask oxygen  Post-op Assessment: Report given to RN, Post -op Vital signs reviewed and stable, Patient moving all extremities and Patient moving all extremities X 4  Post vital signs: Reviewed and stable  Last Vitals:  Vitals:   09/26/16 0547 09/26/16 1437  BP: (!) 153/97   Pulse:    Resp:    Temp:  37.2 C    Last Pain:  Vitals:   09/26/16 1437  TempSrc:   PainSc: Asleep      Patients Stated Pain Goal: 3 (09/26/16 0553)  Complications: No apparent anesthesia complications

## 2016-09-26 NOTE — Anesthesia Procedure Notes (Signed)
Procedure Name: Intubation Performed by: Coralee RudFLORES, Dakota Crist Pre-anesthesia Checklist: Patient identified, Emergency Drugs available, Suction available and Patient being monitored Patient Re-evaluated:Patient Re-evaluated prior to inductionOxygen Delivery Method: Circle System Utilized Preoxygenation: Pre-oxygenation with 100% oxygen Intubation Type: IV induction Laryngoscope Size: Glidescope and 3 Grade View: Grade I Tube type: Subglottic suction tube Tube size: 7.5 mm Number of attempts: 1 Airway Equipment and Method: Stylet Placement Confirmation: ETT inserted through vocal cords under direct vision,  positive ETCO2 and breath sounds checked- equal and bilateral Secured at: 23 cm Tube secured with: Tape Dental Injury: Teeth and Oropharynx as per pre-operative assessment

## 2016-09-27 MED ORDER — METHOCARBAMOL 500 MG PO TABS: 500.0000 mg | ORAL_TABLET | Freq: Three times a day (TID) | ORAL | 0 refills | Status: AC | PRN

## 2016-09-27 MED ORDER — GABAPENTIN 800 MG PO TABS: 400.0000 mg | ORAL_TABLET | Freq: Two times a day (BID) | ORAL | 0 refills | Status: AC

## 2016-09-27 MED ORDER — ONDANSETRON HCL 4 MG PO TABS: 4.0000 mg | ORAL_TABLET | Freq: Three times a day (TID) | ORAL | 0 refills | Status: AC | PRN

## 2016-09-27 MED ORDER — OXYCODONE-ACETAMINOPHEN 10-325 MG PO TABS: 1.0000 | ORAL_TABLET | ORAL | 0 refills | Status: AC | PRN

## 2016-09-27 NOTE — Evaluation (Signed)
Occupational Therapy Evaluation Patient Details Name: Dakota Knight MRN: [id number] DOB: [birth year] Today's Date: 09/27/2016    History of Present Illness 51 yo male s/p C3-6 fusion  Past Medical History:  Diagnosis Date  . GERD (gastroesophageal reflux disease)    occasionallly OTC  . History of kidney stones    2013  . Tinnitus aurium, bilateral       Clinical Impression   Patient evaluated by Occupational Therapy with no further acute OT needs identified. All education has been completed and the patient has no further questions. See below for any follow-up Occupational Therapy or equipment needs. OT to sign off. Thank you for referral.      Follow Up Recommendations  No OT follow up    Equipment Recommendations  None recommended by OT    Recommendations for Other Services       Precautions / Restrictions Precautions Precautions: Cervical Precaution Comments: reviewed cervical precautions in detail for adls Required Braces or Orthoses: Cervical Brace Cervical Brace: At all times      Mobility Bed Mobility Overal bed mobility: Independent                Transfers Overall transfer level: Independent                    Balance                                           ADL either performed or assessed with clinical judgement   ADL Overall ADL's : Independent     Pt educated on bathing and avoid washing directly on incision. Pt educated to use new wash cloth and towel each day. Pt educated to allow water to run across dressing and not to soak in a tub at this time. Pt advised RN will instruct on any bandages required otherwise is open to air.   Cervical precautions ( handout provided): Educated patient on don doff brace with return demonstration, educated on oral care using cups, washing face with cloth, never to wash directly on incision site, avoid neck rotation flexion and extension, positioning with pillows in chair for bil  UE, sleeping positioning, avoiding pushing / pulling with bil UE.Marland Kitchen  Pt educated on need to notify doctor / RN of swallowing changes or choking..                                           Vision         Perception     Praxis      Pertinent Vitals/Pain Pain Assessment: Faces Faces Pain Scale: Hurts a little bit Pain Location: throat sore Pain Descriptors / Indicators: Sore Pain Intervention(s): Monitored during session;Premedicated before session;Repositioned     Hand Dominance Right   Extremity/Trunk Assessment Upper Extremity Assessment Upper Extremity Assessment: Overall WFL for tasks assessed   Lower Extremity Assessment Lower Extremity Assessment: Defer to PT evaluation   Cervical / Trunk Assessment Cervical / Trunk Assessment: Other exceptions (s/p surg)   Communication Communication Communication: No difficulties   Cognition Arousal/Alertness: Awake/alert Behavior During Therapy: WFL for tasks assessed/performed Overall Cognitive Status: Within Functional Limits for tasks assessed  General Comments  drain in and no drainage noted    Exercises     Shoulder Instructions      Home Living Family/patient expects to be discharged to:: Private residence Living Arrangements: Spouse/significant other Available Help at Discharge: Family Type of Home: House             Bathroom Shower/Tub: Walk-in Soil scientistshower   Bathroom Toilet: Standard     Home Equipment: None   Additional Comments: wife (A)      Prior Functioning/Environment Level of Independence: Independent                 OT Problem List:        OT Treatment/Interventions:      OT Goals(Current goals can be found in the care plan section)    OT Frequency:     Barriers to D/C:            Co-evaluation              AM-PAC PT "6 Clicks" Daily Activity     Outcome Measure Help from another person eating  meals?: None Help from another person taking care of personal grooming?: None Help from another person toileting, which includes using toliet, bedpan, or urinal?: None Help from another person bathing (including washing, rinsing, drying)?: None Help from another person to put on and taking off regular upper body clothing?: None Help from another person to put on and taking off regular lower body clothing?: None 6 Click Score: 24   End of Session Nurse Communication: Mobility status;Precautions  Activity Tolerance: Patient tolerated treatment well Patient left: in bed;with call bell/phone within reach;with family/visitor present  OT Visit Diagnosis: Unsteadiness on feet (R26.81)                Time: [id number] OT Time Calculation (min): 15 min Charges:  OT General Charges $OT Visit: 1 Procedure OT Evaluation $OT Eval Moderate Complexity: 1 Procedure G-Codes:     Mateo FlowJones, Brynn   OTR/L Pager: [phone]: [phone] Office: [phone] .   Boone MasterJones, Shewanda Sharpe B 09/27/2016, 8:01 AM

## 2016-09-27 NOTE — Progress Notes (Signed)
    Subjective: Procedure(s) (LRB): Anterior Cervical Discectomy Fusion  Cervical Four-Five, Five-Six, Six-Seven (N/A) 1 Day Post-Op  Patient reports pain as 0 on 0-10 scale.  Reports none arm pain reports incisional neck pain   Positive void Negative bowel movement Positive flatus Negative chest pain or shortness of breath  Objective: Vital signs in last 24 hours: Temp:  [97.8 F (36.6 C)-99 F (37.2 C)] 98.3 F (36.8 C) (05/04 0749) Pulse Rate:  [72-101] 78 (05/04 0749) Resp:  [0-20] 20 (05/04 0749) BP: (145-161)/(94-105) 157/102 (05/04 0749) SpO2:  [95 %-100 %] 100 % (05/04 0749)  Intake/Output from previous day: 05/03 0701 - 05/04 0700 In: 1000 [I.V.:1000] Out: 2350 [Urine:2150; Blood:200]  Labs: No results for input(s): WBC, RBC, HCT, PLT in the last 72 hours. No results for input(s): NA, K, CL, CO2, BUN, CREATININE, GLUCOSE, CALCIUM in the last 72 hours. No results for input(s): LABPT, INR in the last 72 hours.  Physical Exam: ABD soft Sensation intact distally Incision: dressing C/D/I Compartment soft ambulating without difficulty  Left: Deltoid/bicep: 4/5  Tricep: 5/5   Assessment/Plan: Patient stable  xrays satisfactory Mobilization with physical therapy Encourage incentive spirometry Continue care  Advance diet Up with therapy  Doing well with PT - cleared for d/c to home Improved UE strength and motion Pleased with surgical outcome at this point f/u 2 weeks  Venita Lickahari Takeia Ciaravino, MD [facility] [phone]

## 2016-09-27 NOTE — Discharge Instructions (Signed)

## 2016-09-27 NOTE — Op Note (Signed)
NAME:  Dakota Knight, Dakota Knight                    ACCOUNT NO.:  MEDICAL RECORD NO.:  [id number]  LOCATION:                                 FACILITY:  PHYSICIAN:  Dakota Knight, M.D.      DATE OF BIRTH:  DATE OF PROCEDURE:  09/26/2016 DATE OF DISCHARGE:                              OPERATIVE REPORT   PREOPERATIVE DIAGNOSIS:  Cervical spondylitic myeloradiculopathy, C4-5, C5-6, C6-7.  POSTOPERATIVE DIAGNOSIS:  Cervical spondylitic myeloradiculopathy, C4-5, C5-6, C6-7.  OPERATIVE PROCEDURE:  Anterior cervical diskectomy and fusion, C4-C7.  IMPLANT SYSTEM USED:  Titan nanoLOCK intervertebral cage, size 7 large lordotic at each level.  Graft used was the ViviGen graft with the DePuy translational plate 54 mm in length with 14 mm locking screws. Intraoperative neuromonitoring throughout the case with a greater than 50% improvement in signals following the decompression.  FIRST ASSISTANT:  Dakota Knight, Dakota Knight.  HISTORY:  This is a very pleasant 52 year old gentleman, who has been having difficulty walking, increasing neck pain, and significant left deltoid and biceps weakness.  Clinical exam and MRI findings were consistent with cervical spondylitic myeloradiculopathy.  Because of the fragment at the 4-5 level and 5-6 level, I did consider C5 corpectomy. I consented the patient for both ACDF and corpectomy.  All appropriate risks, benefits, and alternatives were discussed with the patient and consent was obtained.  OPERATIVE NOTE:  The patient was brought to the operating room and placed supine on the operating table.  After successful induction of general anesthesia and endotracheal intubation, TEDs, SCDs, and a Foley were inserted.  The neuromonitoring representative then attached all needles and pads for SSEP and evoked motor potential monitoring as well as free running EMGs.  The anterior cervical spine was then prepped and draped in standard fashion for an ACDF.  A time-out was taken  confirming patient, procedure, and all other pertinent important data.  Once this was completed, a longitudinal incision was made starting on the left-hand side.  I did take an x-ray confirming the C4 level and I proceeded along in line with the sternocleidomastoid.  Sharp dissection was carried out down to the platysma.  The platysma was then dissected and I exposed the external jugular.  Identified the sternocleidomastoid and began dissecting along the medial border.  The external drug was protected and I continued sharply dissecting until I could palpate the anterior aspect of cervical spine.  I then swept the trachea and esophagus over to the right, identified the carotid sheath and protected that laterally with a finger.  I then used Kittner dissectors to mobilize the remaining deep cervical fascia and prevertebral fascia to completely expose the anterior aspect of the cervical spine from C4 down to C7.  Once this was done, I placed the needle into the C4-5 disk space and a lateral fluoroscopy was taken to confirm the appropriate level.  Once I confirmed the appropriate level, I then mobilized the longus colli muscle using bipolar electrocautery from the midbody of C4 to the midbody of C7.  I then started at the inferior aspect for my diskectomy. The C6-7 disk space was identified and an annulotomy was performed with 15-blade  scalpel.  Pituitary rongeur was used to remove the bulk of the disk material.  I trimmed down the overhanging osteophyte from the inferior aspect of the C6 vertebral body with a 2 and 3 mm Kerrison punch.  I continued dissecting posteriorly using neural curettes until I was down to the annulus.  I then placed my distraction pins, gently distracted the intervertebral space and maintained it with the distractor.  It should be noted at this point, I was able to release the posterior annulus and resect the posterior osteophytes from C6 and C7 using a 1 mm Kerrison  punch.  I was then able to get my nerve hook underneath the PLL and created a plane between the PLL and the thecal sac.  I exploited this with my 1 mm Kerrison and resected the entire PLL along with the osteophyte that was on the right-hand side.  With the C6- 7 space completely decompressed, I rasped the endplates and made sure I had bleeding subchondral bone.  I measured with trial devices and then placed a size 7 large nanoLOCK Titan cage packed with ViviGen.  With the C6-7 ACDF complete, I then repositioned my retractor and my distraction pins and exposed the C4-5 level.  Using the same technique, I performed a diskectomy at C4-5.  Based on the preoperative imaging, there was caudal extension of this disk fragment on the left-hand side.  Care was taken using my nerve hook to gently dissect along the posterior aspect of the vertebral body to mobilize and remove the fragmented disk material.  Two large pieces of disk material were removed from behind the vertebral body of C5 consistent with what was seen on the preoperative MRI.  At this point, I was actually very pleased with this decompression.  I had released the posterior longitudinal ligament and I had removed a large fragments of disk.  I then checked my evoked motor and sensory potentials and at this time, I saw a greater than 50% improvement specifically in the C5 motor potential.  This was a significant improvement.  I then was able to take an x-ray with my nerve hook going down behind the C5 vertebral body confirming that I had removed the caudal fragment.  At this point with an adequate diskectomy and decompression, removal of the fragment and greater than 50% improvement in my evoked motor potentials, I elected not to proceed with the corpectomy.  I felt as though I had the disk completely decompressed and the spinal canal completely decompressed.  I then switched my retractor to expose the C5-6 level and using the same  technique that I just implemented removed the disk at C5-6, took down the posterior longitudinal ligament and resected the left-handed osteophyte that was seen on the MRI.  Again, I passed my nerve hook superiorly behind the body of C5 and this was freely done.  I was actually very pleased with this diskectomy decompression as well.  I placed the same size implants as I placed at C6-7 after trailing.  Distraction pins were removed and then I placed a 54 mm length translational plate from DePuy affixed with 14 mm self-drilling screws at C4, C5, C6, and C7.  All screws had excellent purchase.  I then checked the esophagus to make sure it was not entrapped beneath the plate and it was in proper position.  I then placed a drain, irrigated the wound copiously with normal saline and made sure I had hemostasis.  I then removed all my retractors  and returned the trachea and esophagus to midline.  The platysma was then closed with interrupted 2-0 Vicryl suture and the skin with a 3-0 Monocryl.  Steri-Strips, dry dressing, and an Aspen collar were applied.  The patient was ultimately extubated, transferred to the PACU without incident.  At the end of the case, all needle and sponge counts were correct.  There were no adverse events.     Dakota Knight, M.D.     DDB/MEDQ  D:  09/26/2016  T:  09/27/2016  Job:  [id number]

## 2016-09-27 NOTE — Progress Notes (Signed)
Patient is discharged from room 3C02 at this time. Alert and in stable condition. IV site d/c'd and instructions read to patient and wife with understanding verbalized. Left unit via wheelchair with all belongings at side. 

## 2016-09-27 NOTE — Evaluation (Signed)
Physical Therapy Evaluation Patient Details Name: Dakota Knight MRN: [id number] DOB: 1966-02-28 Today's Date: 09/27/2016   History of Present Illness  51 yo male s/p C3-6 fusion with PMH significant for previous lumbar surgery, GERD, tinnitus and kidney stones.  Clinical Impression  Pt presents to PT with decreased tolerance for functional mobility due to above procedure and associated pain, weakness, and balance deficits.  He and his wife were educated on cervical precautions, safe functional movements, and brace wear and application.  He ambulated 300 feet at Modified Independent level with no AD and completed stair training requiring supervision for safety.  PTA, he was independent with all mobility and lives with hs wife who is available to provide 24/7 assist if needed.  Will follow acutely.    Follow Up Recommendations No PT follow up;Supervision for mobility/OOB    Equipment Recommendations  None recommended by PT    Recommendations for Other Services       Precautions / Restrictions Precautions Precautions: Cervical Precaution Comments: Cervical precaution sheet provided and reviewed with pt and wife. Required Braces or Orthoses: Cervical Brace Cervical Brace: Hard collar;At all times Restrictions Weight Bearing Restrictions: No      Mobility  Bed Mobility Overal bed mobility: Modified Independent             General bed mobility comments: Cues and extra time for log roll technique/precaution maintenance.  Pt did not use handrail.  Transfers Overall transfer level: Modified independent Equipment used: None             General transfer comment: Pt cued to and able to maintain precautions with transfers  Ambulation/Gait Ambulation/Gait assistance: Modified independent (Device/Increase time) Ambulation Distance (Feet): 300 Feet Assistive device: None Gait Pattern/deviations: Step-through pattern;Narrow base of support Gait velocity: normal Gait velocity  interpretation: at or above normal speed for age/gender General Gait Details: Pt with overall steady gait.  One instance of LOB when pt appeard to turn too soon into doorway, bumping door frame and able to recover without assistance.  Stairs Stairs: Yes Stairs assistance: Supervision Stair Management: One rail Right;Step to pattern Number of Stairs: 11 General stair comments: Cues required for step to sequence and number of steps remaining for descend.  Pt overall steady on stairs.   Wheelchair Mobility    Modified Rankin (Stroke Patients Only)       Balance Overall balance assessment: Modified Independent                                           Pertinent Vitals/Pain Pain Assessment: 0-10 Pain Score: 3  Faces Pain Scale: Hurts a little bit Pain Location: throat and L shoulder Pain Descriptors / Indicators: Sore Pain Intervention(s): Monitored during session;Limited activity within patient's tolerance;Repositioned;Patient requesting pain meds-RN notified    Home Living Family/patient expects to be discharged to:: Private residence Living Arrangements: Spouse/significant other Available Help at Discharge: Family;Available 24 hours/day Type of Home: House Home Access: Stairs to enter Entrance Stairs-Rails: Doctor, general practice of Steps: 5 Home Layout: One level Home Equipment: None Additional Comments: wife (A)    Prior Function Level of Independence: Independent               Hand Dominance   Dominant Hand: Right    Extremity/Trunk Assessment   Upper Extremity Assessment Upper Extremity Assessment: Defer to OT evaluation    Lower Extremity Assessment Lower  Extremity Assessment: Overall WFL for tasks assessed    Cervical / Trunk Assessment Cervical / Trunk Assessment: Normal  Communication   Communication: No difficulties  Cognition Arousal/Alertness: Awake/alert Behavior During Therapy: WFL for tasks  assessed/performed Overall Cognitive Status: Within Functional Limits for tasks assessed                                        General Comments General comments (skin integrity, edema, etc.): Drain at incision site.  Pt wife present for session.     Exercises     Assessment/Plan    PT Assessment Patient needs continued PT services  PT Problem List Decreased activity tolerance;Decreased mobility;Decreased coordination;Decreased safety awareness;Pain       PT Treatment Interventions DME instruction;Gait training;Stair training;Balance training;Patient/family education    PT Goals (Current goals can be found in the Care Plan section)  Acute Rehab PT Goals Patient Stated Goal: to get back to yard work PT Goal Formulation: With patient Time For Goal Achievement: 10/04/16 Potential to Achieve Goals: Good    Frequency Min 5X/week   Barriers to discharge        Co-evaluation               AM-PAC PT "6 Clicks" Daily Activity  Outcome Measure Difficulty turning over in bed (including adjusting bedclothes, sheets and blankets)?: A Little Difficulty moving from lying on back to sitting on the side of the bed? : A Little Difficulty sitting down on and standing up from a chair with arms (e.g., wheelchair, bedside commode, etc,.)?: Total Help needed moving to and from a bed to chair (including a wheelchair)?: None Help needed walking in hospital room?: A Little Help needed climbing 3-5 steps with a railing? : A Little 6 Click Score: 17    End of Session Equipment Utilized During Treatment: Gait belt;Cervical collar Activity Tolerance: Patient tolerated treatment well Patient left: in chair;with call bell/phone within reach;with nursing/sitter in room;with family/visitor present Nurse Communication: Mobility status PT Visit Diagnosis: Other abnormalities of gait and mobility (R26.89);Pain Pain - part of body:  (neck)    Time: [id number] PT Time Calculation  (min) (ACUTE ONLY): 29 min   Charges:         PT G Codes:        Willaim Rayasachel Kallen Delatorre SPT  Willaim RayasRachel Edilberto Roosevelt 09/27/2016, 10:58 AM

## 2016-09-30 ENCOUNTER — Encounter (HOSPITAL_COMMUNITY): Payer: Self-pay | Admitting: Orthopedic Surgery

## 2016-10-03 NOTE — Discharge Summary (Signed)
Physician Discharge Summary  Patient ID: Marene LenzDavid M Lykins MRN: [id number] DOB/AGE: 53/09/1965 53 y.o.  Admit date: 09/26/2016 Discharge date: 10/03/2016  Admission Diagnoses:  Cervical Spondylitic Myeloradiculopathy  Discharge Diagnoses:  Active Problems:   Myelopathy [facility](HCC)   Past Medical History:  Diagnosis Date  . GERD (gastroesophageal reflux disease)    occasionallly OTC  . History of kidney stones    2013  . Tinnitus aurium, bilateral     Surgeries: Procedure(s): Anterior Cervical Discectomy Fusion  Cervical Four-Five, Five-Six, Six-Seven on 09/26/2016   Consultants (if any):   Discharged Condition: Improved  Hospital Course: Marene LenzDavid M Moring is an 51 y.o. male who was admitted 09/26/2016 with a diagnosis of  Cervical spondylitic Myeloradiculopathy and went to the operating room on 09/26/2016 and underwent the above named procedures.  Post op day one pts pain is well controlled on oral medications.  He denies arm pain.  Pt is voiding w/o difficulty. Pt is ambulating in hallway.  Pt cleared by OT for DC home.   He was given perioperative antibiotics:  Anti-infectives    Start     Dose/Rate Route Frequency Ordered Stop   09/26/16 2000  ceFAZolin (ANCEF) IVPB 2g/100 mL premix     2 g 200 mL/hr over 30 Minutes Intravenous Every 8 hours 09/26/16 1624 09/27/16 0328   09/26/16 1123  ceFAZolin (ANCEF) IVPB 2g/100 mL premix  Status:  Discontinued     2 g 200 mL/hr over 30 Minutes Intravenous 30 min pre-op 09/26/16 1124 09/26/16 1607   09/26/16 0242  ceFAZolin (ANCEF) IVPB 2g/100 mL premix     2 g 200 mL/hr over 30 Minutes Intravenous 30 min pre-op 09/26/16 0242 09/26/16 1238    .  He was given sequential compression devices, early ambulation, and TED for DVT prophylaxis.  He benefited maximally from the hospital stay and there were no complications.    Recent vital signs:  Vitals:   09/27/16 0749 09/27/16 0751  BP: (!) 157/102 (!) 173/108  Pulse: 78   Resp: 20   Temp: 98.3  F (36.8 C)     Recent laboratory studies:  Lab Results  Component Value Date   HGB 15.4 09/23/2016   HGB 15.0 01/14/2011   Lab Results  Component Value Date   WBC 4.1 09/23/2016   PLT 192 09/23/2016   No results found for: INR Lab Results  Component Value Date   NA 138 09/23/2016   K 4.0 09/23/2016   CL 105 09/23/2016   CO2 27 09/23/2016   BUN 14 09/23/2016   CREATININE 0.94 09/23/2016   GLUCOSE 104 (H) 09/23/2016    Discharge Medications:   Allergies as of 09/27/2016      Reactions   No Known Allergies       Medication List    TAKE these medications   gabapentin 800 MG tablet Commonly known as:  NEURONTIN Take 0.5 tablets (400 mg total) by mouth 2 (two) times daily. What changed:  how much to take   methocarbamol 500 MG tablet Commonly known as:  ROBAXIN Take 1 tablet (500 mg total) by mouth 3 (three) times daily as needed for muscle spasms.   multivitamin with minerals tablet Take 1 tablet by mouth daily.   ondansetron 4 MG tablet Commonly known as:  ZOFRAN Take 1 tablet (4 mg total) by mouth every 8 (eight) hours as needed for nausea or vomiting.   oxyCODONE-acetaminophen 10-325 MG tablet Commonly known as:  PERCOCET Take 1 tablet by mouth every 4 (  four) hours as needed for pain.       Diagnostic Studies: Dg Cervical Spine 2 Or 3 Views  Result Date: 09/26/2016 CLINICAL DATA:  Status post cervical spine fusion. EXAM: CERVICAL SPINE - 2-3 VIEW COMPARISON:  None. FINDINGS: Interval interbody metal spacer and anterior screw and plate fusion at the C4 through C7 levels. The inferior C6 level and below are obscured by the patient's shoulders on the lateral view. Normal alignment to that level. Bilateral facet degenerative changes. Anterior cervical drain on the left. IMPRESSION: Anterior cervical fusion at the C4 through C7 levels, in adequately visualized inferiorly in the lateral projection due to overlapping of the patient's shoulders. Routine views are  recommended when possible. Electronically Signed   By: Beckie Salts M.D.   On: 09/26/2016 15:34   Dg Cervical Spine Complete  Result Date: 09/26/2016 CLINICAL DATA:  Cervical spine fusion. EXAM: DG C-ARM 61-120 MIN; CERVICAL SPINE - COMPLETE 4+ VIEW COMPARISON:  02/15/2016. FINDINGS: C4 through C7 anterior and interbody fusion. Anatomic alignment. Hardware intact. Seven images obtained. 1 minutes 2 seconds fluoroscopy time utilized. IMPRESSION: C4 through C7 anterior interbody fusion. Electronically Signed   By: Maisie Fus  Register   On: 09/26/2016 15:56   Dg C-arm 61-120 Min  Result Date: 09/26/2016 CLINICAL DATA:  Cervical spine fusion. EXAM: DG C-ARM 61-120 MIN; CERVICAL SPINE - COMPLETE 4+ VIEW COMPARISON:  02/15/2016. FINDINGS: C4 through C7 anterior and interbody fusion. Anatomic alignment. Hardware intact. Seven images obtained. 1 minutes 2 seconds fluoroscopy time utilized. IMPRESSION: C4 through C7 anterior interbody fusion. Electronically Signed   By: Maisie Fus  Register   On: 09/26/2016 15:56    Disposition: 01-Home or Self Care  Post op medication provided Pt will present to clinic in 2 weeks  Discharge Instructions    Incentive spirometry RT    Complete by:  As directed       Follow-up Information    Venita Lick, MD Follow up in 2 week(s).   Specialty:  Orthopedic Surgery Contact information: [address] [place] [address] [phone]            Signed: Kirt Boys 10/03/2016, 12:52 PM

## 2016-10-22 ENCOUNTER — Encounter (HOSPITAL_COMMUNITY): Payer: Self-pay | Admitting: Orthopedic Surgery

## 2017-01-16 NOTE — Addendum Note (Signed)
Addendum  created 01/16/17 1358 by Joshiah Traynham, Levert, MD   Sign clinical note    

## 2017-08-18 ENCOUNTER — Ambulatory Visit: Payer: BC Managed Care – PPO | Admitting: Podiatry

## 2017-08-18 DIAGNOSIS — M792 Neuralgia and neuritis, unspecified: Secondary | ICD-10-CM

## 2017-08-18 DIAGNOSIS — M2142 Flat foot [pes planus] (acquired), left foot: Secondary | ICD-10-CM

## 2017-08-18 DIAGNOSIS — G5763 Lesion of plantar nerve, bilateral lower limbs: Secondary | ICD-10-CM | POA: Diagnosis not present

## 2017-08-18 DIAGNOSIS — M7742 Metatarsalgia, left foot: Secondary | ICD-10-CM | POA: Diagnosis not present

## 2017-08-18 DIAGNOSIS — M5432 Sciatica, left side: Secondary | ICD-10-CM | POA: Diagnosis not present

## 2017-08-18 DIAGNOSIS — M2141 Flat foot [pes planus] (acquired), right foot: Secondary | ICD-10-CM | POA: Diagnosis not present

## 2017-08-18 DIAGNOSIS — M5431 Sciatica, right side: Secondary | ICD-10-CM | POA: Diagnosis not present

## 2017-08-18 DIAGNOSIS — M7741 Metatarsalgia, right foot: Secondary | ICD-10-CM

## 2017-08-18 MED ORDER — NONFORMULARY OR COMPOUNDED ITEM: 1.0000 g | Freq: Four times a day (QID) | 2 refills | Status: AC

## 2017-08-18 NOTE — Progress Notes (Signed)
  Subjective:  Patient ID: Marene LenzDavid M Mcmath, male    DOB: 04/15/1966,  MRN: [id number]  Chief Complaint  Patient presents with  . Numbness    B/L bottom feet numbness and burning x years; 7/10 sharp burning sensation Tx: icing, custom orthotics (not helping) Pt. stated," I can't put my feet on the floor because they start burning so bad."    52 y.o. male presents with the above complaint.  Reports bilateral numbness and burning to the feet.  Present for years.  Reports 7 out of 10 sharp burning sensation.  Has tried custom orthotics in the past without help.  Has tried icing.  States that as soon as his feet hit the floor he starts noticing burning.  Reports history of herniated disc affecting L4/L5 for which she had surgery to decompress the nerve root.  Also reports cervical spinal fusion.  Past Medical History:  Diagnosis Date  . GERD (gastroesophageal reflux disease)    occasionallly OTC  . History of kidney stones    2013  . Tinnitus aurium, bilateral    Past Surgical History:  Procedure Laterality Date  . ANTERIOR CERVICAL DECOMP/DISCECTOMY FUSION N/A 09/26/2016   Procedure: Anterior Cervical Discectomy Fusion  Cervical Four-Five, Five-Six, Six-Seven;  Surgeon: Venita LickBrooks, Dahari, MD;  [facility];  Service: Orthopedics;  Laterality: N/A;  Requests 5 hours  . BACK SURGERY     2012  lower back   . COLONOSCOPY      Current Outpatient Medications:  .  gabapentin (NEURONTIN) 800 MG tablet, Take 0.5 tablets (400 mg total) by mouth 2 (two) times daily., Disp: 30 tablet, Rfl: 0 .  methocarbamol (ROBAXIN) 500 MG tablet, Take 1 tablet (500 mg total) by mouth 3 (three) times daily as needed for muscle spasms., Disp: 21 tablet, Rfl: 0 .  Multiple Vitamins-Minerals (MULTIVITAMIN WITH MINERALS) tablet, Take 1 tablet by mouth daily., Disp: , Rfl:  .  NONFORMULARY OR COMPOUNDED ITEM, Apply 1-2 g topically 4 (four) times daily., Disp: 120 each, Rfl: 2 .  ondansetron (ZOFRAN) 4 MG tablet, Take 1  tablet (4 mg total) by mouth every 8 (eight) hours as needed for nausea or vomiting., Disp: 20 tablet, Rfl: 0 .  oxyCODONE-acetaminophen (PERCOCET) 10-325 MG tablet, Take 1 tablet by mouth every 4 (four) hours as needed for pain., Disp: 42 tablet, Rfl: 0  Allergies  Allergen Reactions  . No Known Allergies    Review of Systems Objective:  There were no vitals filed for this visit. General AA&O x3. Normal mood and affect.  Vascular Dorsalis pedis and posterior tibial pulses  present 2+ bilaterally  Capillary refill normal to all digits. Pedal hair growth normal.  Neurologic Epicritic sensation grossly present. Negative Tinel sign over the tarsal tunnel, dorsal midfoot, all interspaces bilateral.  Negative single leg raise test.  Dermatologic No open lesions. Interspaces clear of maceration. Nails well groomed and normal in appearance.  Orthopedic: MMT 5/5 in dorsiflexion, plantarflexion, inversion, and eversion. Normal joint ROM without pain or crepitus.   Assessment & Plan:  Patient was evaluated and treated and all questions answered.  Metatarsalgia/Neuritis/Neuroma -Discussed multiple etiologies including low back issue, possible bilateral tarsal tunnel, bilateral Morton's neuromas,.  Discussed possible treatments including compound pain cream, orthotics.  We will proceed with these to start.  Also discussed possible EMG nerve conduction study however patient states that he had that in the past and it was normal.   No follow-ups on file.

## 2017-09-15 ENCOUNTER — Encounter: Payer: Self-pay | Admitting: Podiatry

## 2017-09-15 ENCOUNTER — Ambulatory Visit: Payer: BC Managed Care – PPO | Admitting: Podiatry

## 2017-09-15 DIAGNOSIS — M7742 Metatarsalgia, left foot: Secondary | ICD-10-CM

## 2017-09-15 DIAGNOSIS — M792 Neuralgia and neuritis, unspecified: Secondary | ICD-10-CM

## 2017-09-15 DIAGNOSIS — M7741 Metatarsalgia, right foot: Secondary | ICD-10-CM

## 2017-09-15 DIAGNOSIS — G5753 Tarsal tunnel syndrome, bilateral lower limbs: Secondary | ICD-10-CM

## 2017-09-15 NOTE — Progress Notes (Signed)
  Subjective:  Patient ID: Dakota Dakota Knight M Dakota Knight, male    DOB: 12/29/1965,  MRN: [id number]  No chief complaint on file.  52 y.o. male returns for the above complaint. States that he has been lacing his shoes differently which help with the ball of the foot pain. Has been using the compound pain cream for the numbness and burning and states it helops. States his pain is improving but still there.  Objective:  There were no vitals filed for this visit. General AA&O x3. Normal mood and affect.  Vascular Pedal pulses palpable.  Neurologic Epicritic sensation grossly intact. Negative tinel's sign over the tarsal tunnel bilat.  Dermatologic No open lesions. Skin normal texture and turgor.  Orthopedic: No pain to palpation at the forefoot bilat.   Assessment & Plan:  Patient was evaluated and treated and all questions answered.  Metatarsalgia -Improved. -Awaiting orthotics.  Neuralgia / Tarsal Tunnel Bilat -Injections delivered to both tarsal tunnels as below.  Procedure: Tarsal Tunnel Injection Location: Bilateral   Skin Prep: Alcohol. Injectate: 1 cc 1% lidocaine plain, 1 cc dexamethasone phosphate. Disposition: Patient tolerated procedure well. Injection site dressed with a band-aid.   Return in about 3 weeks (around 10/06/2017) for Tarsal Tunnel F/u.

## 2017-10-07 ENCOUNTER — Ambulatory Visit: Payer: BC Managed Care – PPO | Admitting: Podiatry

## 2017-10-07 DIAGNOSIS — M7742 Metatarsalgia, left foot: Secondary | ICD-10-CM

## 2017-10-07 DIAGNOSIS — G5753 Tarsal tunnel syndrome, bilateral lower limbs: Secondary | ICD-10-CM

## 2017-10-07 DIAGNOSIS — M792 Neuralgia and neuritis, unspecified: Secondary | ICD-10-CM

## 2017-10-07 DIAGNOSIS — M7741 Metatarsalgia, right foot: Secondary | ICD-10-CM

## 2017-10-07 NOTE — Progress Notes (Signed)
  Subjective:  Patient ID: Dakota Knight, male    DOB: [birth year],  MRN: [id number]  No chief complaint on file.  52 y.o. male returns for the above complaint.  States that the injections really helped.  States that the toes are still numb but the pain is gone.  States that he noticed the pain was gone as soon as he left the office last time.  Has been using a compound pain cream which she also states helps.  Objective:  There were no vitals filed for this visit. General AA&O x3. Normal mood and affect.  Vascular Pedal pulses palpable.  Neurologic Epicritic sensation grossly intact.  Dermatologic No open lesions. Skin normal texture and turgor.  Orthopedic: No pain to palpation either foot.   Assessment & Plan:  Patient was evaluated and treated and all questions answered.  Tarsal Tunnel Syndrome Bilat -Improved s/p injections. No new injection today. -Refer to [facility] for [employer]  Metatarsalgia -Awaiting orthotics. Rescanned today.  No follow-ups on file.

## 2017-10-23 ENCOUNTER — Telehealth: Payer: Self-pay | Admitting: Podiatry

## 2017-10-23 NOTE — Telephone Encounter (Signed)
Pt left voicemail this morning checking to see if his orthotics are back yet. Can you please check and let pt know if they are in.

## 2017-10-23 NOTE — Telephone Encounter (Signed)
Patient notified will have wife pick up as this is not his first pair

## 2017-11-25 ENCOUNTER — Ambulatory Visit: Payer: BC Managed Care – PPO | Admitting: Podiatry

## 2017-11-25 DIAGNOSIS — M7742 Metatarsalgia, left foot: Secondary | ICD-10-CM

## 2017-11-25 DIAGNOSIS — M7741 Metatarsalgia, right foot: Secondary | ICD-10-CM | POA: Diagnosis not present

## 2017-11-25 DIAGNOSIS — M792 Neuralgia and neuritis, unspecified: Secondary | ICD-10-CM

## 2017-11-25 DIAGNOSIS — G5753 Tarsal tunnel syndrome, bilateral lower limbs: Secondary | ICD-10-CM | POA: Diagnosis not present

## 2017-11-25 NOTE — Progress Notes (Signed)
  Subjective:  Patient ID: Dakota Knight, male    DOB: 07/17/1965,  MRN: [id number]  Chief Complaint  Patient presents with  . tarsal tunnel    F?U B/L tarsal tunnel syndrome Pt. stated," I still don't have pain but I still have numbness." Tx: orthotics (helps) -Pt. have not done PT due to lack of time   52 y.o. male returns for the above complaint. Did not start PT for [facility]. States the orthotics are helping his feet.  Objective:  There were no vitals filed for this visit. General AA&O x3. Normal mood and affect.  Vascular Pedal pulses palpable.  Neurologic Epicritic sensation grossly intact.  Dermatologic No open lesions. Skin normal texture and turgor.  Orthopedic: No pain to palpation either foot.   Assessment & Plan:  Patient was evaluated and treated and all questions answered.  Tarsal Tunnel Syndrome Bilat -No injection today. -[facility]  Metatarsalgia -Improved.  Follow up PRN.  No follow-ups on file.

## 2018-01-01 IMAGING — DX DG CERVICAL SPINE 2 OR 3 VIEWS
2 series · 2 of 2 positions shown · non-contrast
Comparison: None.

CLINICAL DATA: Status post cervical spine fusion.

EXAM:
CERVICAL SPINE - 2-3 VIEW

[c-spine lat]
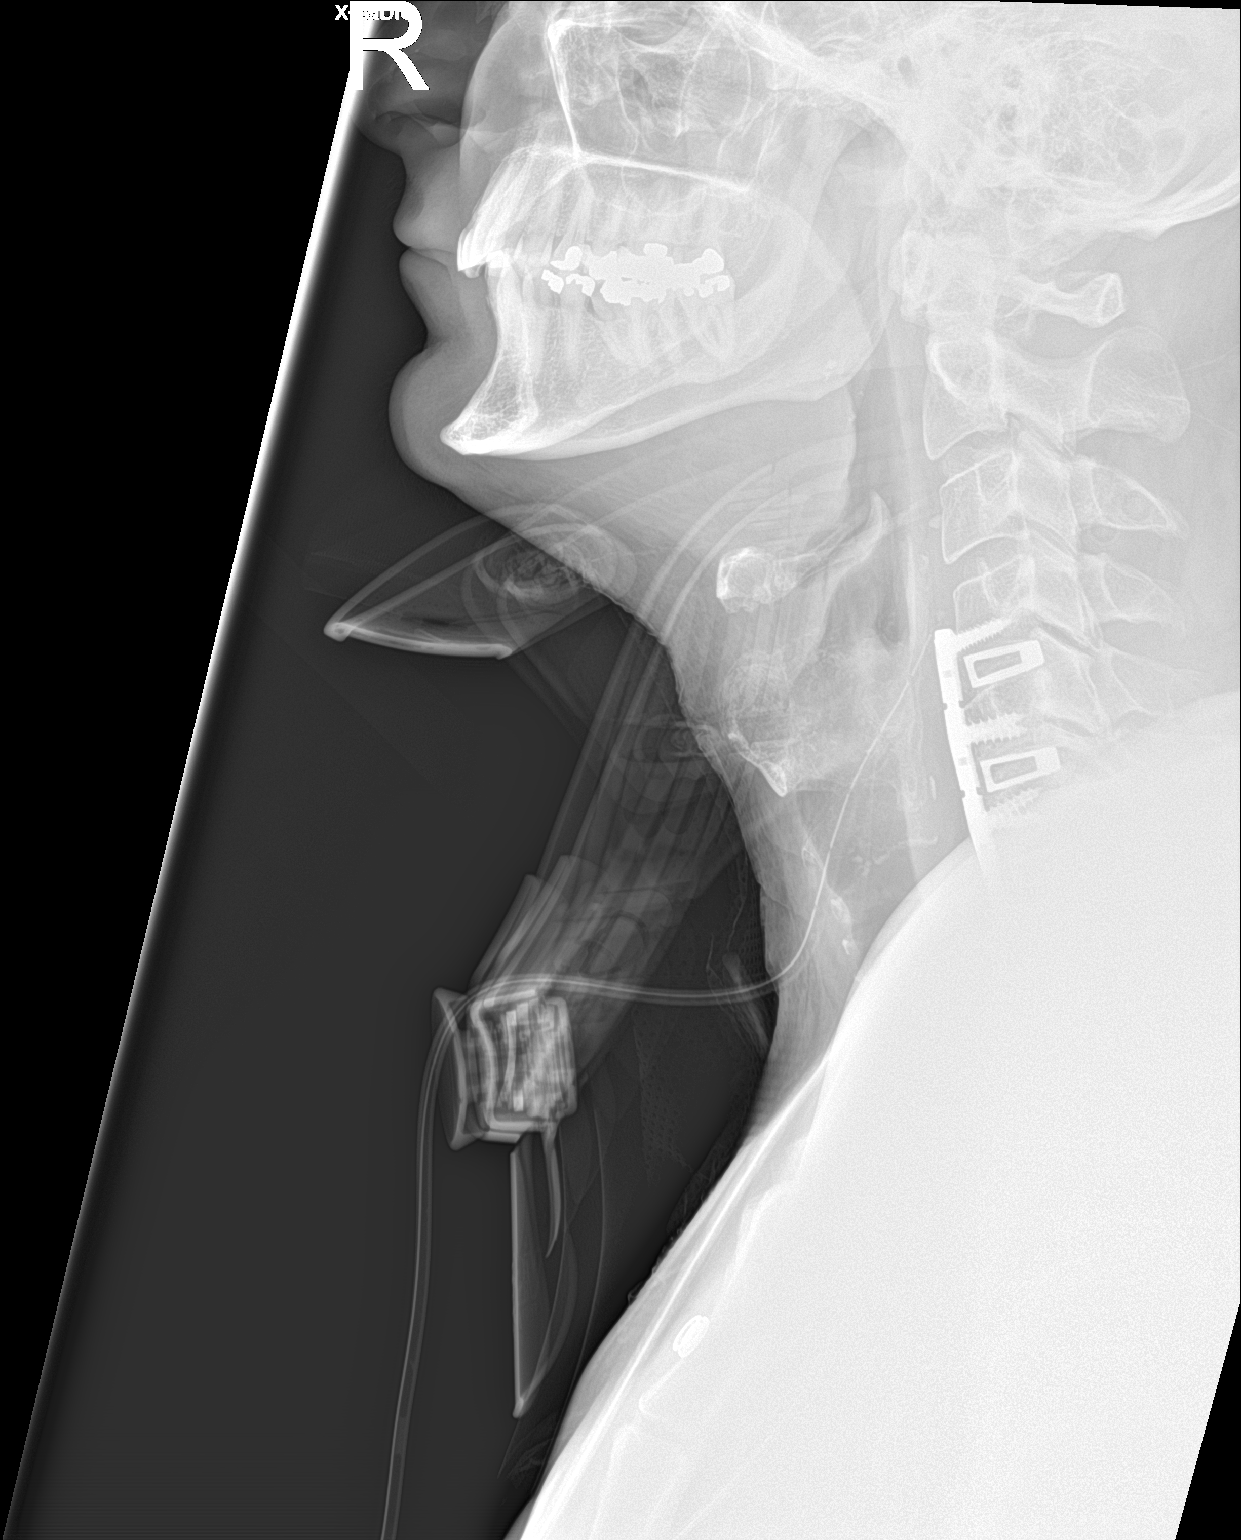

[c-spine ap]
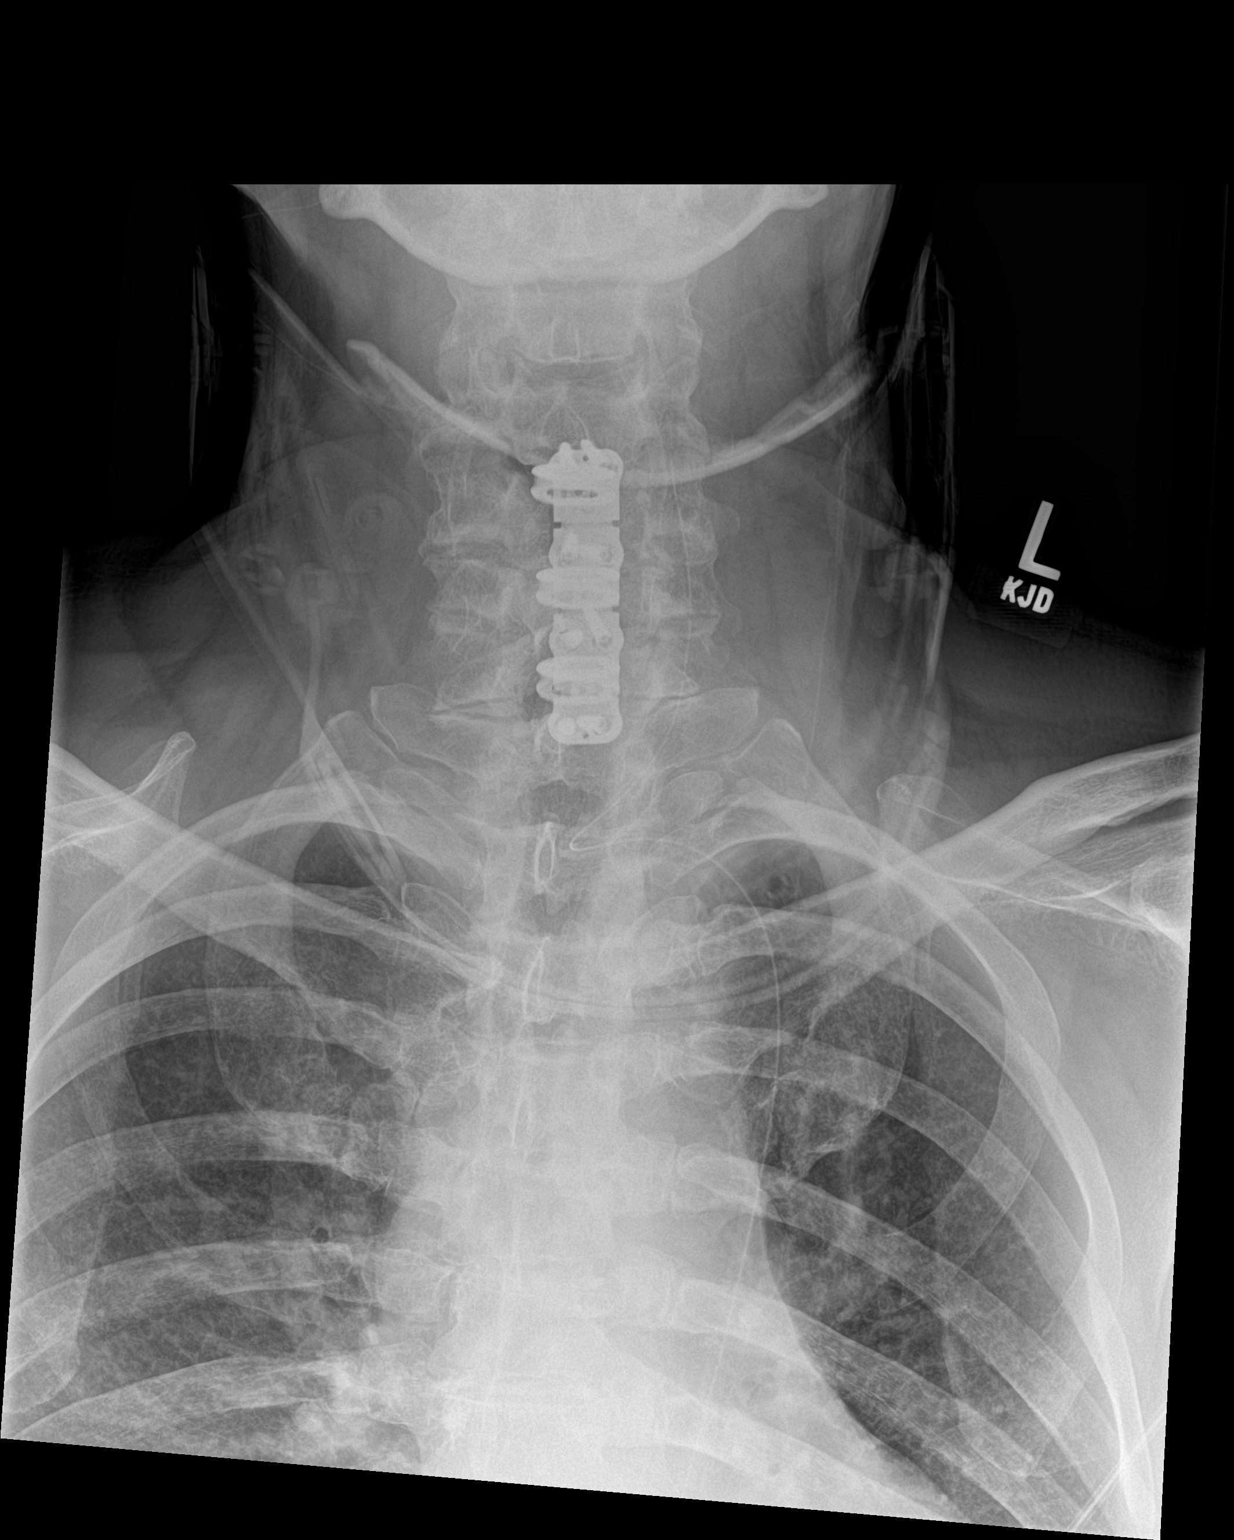

[2 of 2 positions shown; findings below may reference images not displayed]

FINDINGS: Interval interbody metal spacer and anterior screw and plate fusion
at the C4 through C7 levels. The inferior C6 level and below are
obscured by the patient's shoulders on the lateral view. Normal
alignment to that level. Bilateral facet degenerative changes.
Anterior cervical drain on the left.
IMPRESSION: Anterior cervical fusion at the C4 through C7 levels, in adequately
visualized inferiorly in the lateral projection due to overlapping
of the patient's shoulders. Routine views are recommended when
possible.

## 2020-05-18 ENCOUNTER — Ambulatory Visit (INDEPENDENT_AMBULATORY_CARE_PROVIDER_SITE_OTHER): Payer: BC Managed Care – PPO | Admitting: Psychology

## 2020-05-18 DIAGNOSIS — F4322 Adjustment disorder with anxiety: Secondary | ICD-10-CM | POA: Diagnosis not present

## 2020-05-23 ENCOUNTER — Ambulatory Visit (INDEPENDENT_AMBULATORY_CARE_PROVIDER_SITE_OTHER): Payer: BC Managed Care – PPO | Admitting: Psychology

## 2020-05-23 DIAGNOSIS — F4322 Adjustment disorder with anxiety: Secondary | ICD-10-CM

## 2020-05-25 ENCOUNTER — Ambulatory Visit (INDEPENDENT_AMBULATORY_CARE_PROVIDER_SITE_OTHER): Payer: BC Managed Care – PPO | Admitting: Psychology

## 2020-05-25 DIAGNOSIS — F4322 Adjustment disorder with anxiety: Secondary | ICD-10-CM | POA: Diagnosis not present

## 2020-06-07 ENCOUNTER — Ambulatory Visit (INDEPENDENT_AMBULATORY_CARE_PROVIDER_SITE_OTHER): Payer: BC Managed Care – PPO | Admitting: Psychology

## 2020-06-07 DIAGNOSIS — F4322 Adjustment disorder with anxiety: Secondary | ICD-10-CM

## 2020-06-09 ENCOUNTER — Ambulatory Visit (INDEPENDENT_AMBULATORY_CARE_PROVIDER_SITE_OTHER): Payer: BC Managed Care – PPO | Admitting: Psychology

## 2020-06-09 DIAGNOSIS — F4322 Adjustment disorder with anxiety: Secondary | ICD-10-CM

## 2020-06-12 ENCOUNTER — Ambulatory Visit (INDEPENDENT_AMBULATORY_CARE_PROVIDER_SITE_OTHER): Payer: BC Managed Care – PPO | Admitting: Psychology

## 2020-06-12 DIAGNOSIS — F4322 Adjustment disorder with anxiety: Secondary | ICD-10-CM | POA: Diagnosis not present

## 2020-07-07 ENCOUNTER — Ambulatory Visit: Payer: BC Managed Care – PPO | Admitting: Psychology

## 2022-10-08 ENCOUNTER — Other Ambulatory Visit: Payer: Self-pay | Admitting: Neurological Surgery

## 2022-10-08 DIAGNOSIS — G629 Polyneuropathy, unspecified: Secondary | ICD-10-CM

## 2022-11-08 ENCOUNTER — Other Ambulatory Visit: Payer: Self-pay

## 2022-11-15 ENCOUNTER — Inpatient Hospital Stay: Admission: RE | Admit: 2022-11-15 | Payer: Self-pay | Source: Ambulatory Visit

## 2022-12-06 ENCOUNTER — Other Ambulatory Visit: Payer: Self-pay

## 2023-01-24 ENCOUNTER — Other Ambulatory Visit: Payer: Self-pay

## 2023-02-14 ENCOUNTER — Other Ambulatory Visit: Payer: Self-pay

## 2023-03-04 ENCOUNTER — Other Ambulatory Visit: Payer: Self-pay

## 2023-03-07 ENCOUNTER — Ambulatory Visit
Admission: RE | Admit: 2023-03-07 | Discharge: 2023-03-07 | Disposition: A | Discharge status: Home / Self Care | Payer: Non-veteran care | Source: Ambulatory Visit | Attending: Neurological Surgery | Admitting: Neurological Surgery

## 2023-03-07 DIAGNOSIS — G629 Polyneuropathy, unspecified: Secondary | ICD-10-CM
